# Patient Record
Sex: Male | Born: 1970 | Race: Black or African American | Hispanic: No | Marital: Married | Smoking: Never smoker
Health system: Southern US, Community
[De-identification: ages and names within clinical notes are randomized; demographics above are authoritative.]

## PROBLEM LIST (undated history)

## (undated) DIAGNOSIS — H9202 Otalgia, left ear: Secondary | ICD-10-CM

## (undated) DIAGNOSIS — E7800 Pure hypercholesterolemia, unspecified: Secondary | ICD-10-CM

## (undated) HISTORY — DX: Otalgia, left ear: H92.02

## (undated) HISTORY — PX: TONSILLECTOMY: SUR1361

---

## 1999-04-24 ENCOUNTER — Emergency Department (HOSPITAL_COMMUNITY): Admission: EM | Admit: 1999-04-24 | Discharge: 1999-04-24 | Payer: Self-pay | Admitting: Emergency Medicine

## 2003-09-13 ENCOUNTER — Emergency Department (HOSPITAL_COMMUNITY): Admission: EM | Admit: 2003-09-13 | Discharge: 2003-09-14 | Payer: Self-pay | Admitting: Family Medicine

## 2005-02-09 ENCOUNTER — Emergency Department (HOSPITAL_COMMUNITY): Admission: AD | Admit: 2005-02-09 | Discharge: 2005-02-09 | Payer: Self-pay | Admitting: Emergency Medicine

## 2007-07-28 ENCOUNTER — Encounter: Admission: RE | Admit: 2007-07-28 | Discharge: 2007-07-28 | Payer: Self-pay | Admitting: Internal Medicine

## 2012-06-27 ENCOUNTER — Encounter (HOSPITAL_COMMUNITY): Payer: Self-pay | Admitting: *Deleted

## 2012-06-27 ENCOUNTER — Emergency Department (HOSPITAL_COMMUNITY)
Admission: EM | Admit: 2012-06-27 | Discharge: 2012-06-27 | Disposition: A | Discharge status: Home / Self Care | Payer: 59 | Attending: Emergency Medicine | Admitting: Emergency Medicine

## 2012-06-27 ENCOUNTER — Emergency Department (HOSPITAL_COMMUNITY): Payer: 59

## 2012-06-27 ENCOUNTER — Other Ambulatory Visit: Payer: Self-pay

## 2012-06-27 DIAGNOSIS — R002 Palpitations: Secondary | ICD-10-CM | POA: Insufficient documentation

## 2012-06-27 DIAGNOSIS — R079 Chest pain, unspecified: Secondary | ICD-10-CM | POA: Insufficient documentation

## 2012-06-27 HISTORY — DX: Pure hypercholesterolemia, unspecified: E78.00

## 2012-06-27 LAB — CBC WITH DIFFERENTIAL/PLATELET
Basophils Absolute: 0 10*3/uL (ref 0.0–0.1)
Basophils Relative: 0 % (ref 0–1)
Eosinophils Absolute: 0.1 10*3/uL (ref 0.0–0.7)
Eosinophils Relative: 3 % (ref 0–5)
HCT: 42.4 % (ref 39.0–52.0)
Hemoglobin: 14.4 g/dL (ref 13.0–17.0)
Lymphocytes Relative: 47 % — ABNORMAL HIGH (ref 12–46)
Lymphs Abs: 2.5 10*3/uL (ref 0.7–4.0)
MCH: 28.5 pg (ref 26.0–34.0)
MCHC: 34 g/dL (ref 30.0–36.0)
MCV: 84 fL (ref 78.0–100.0)
Monocytes Absolute: 0.3 10*3/uL (ref 0.1–1.0)
Monocytes Relative: 6 % (ref 3–12)
Neutro Abs: 2.3 10*3/uL (ref 1.7–7.7)
Neutrophils Relative %: 44 % (ref 43–77)
Platelets: 228 10*3/uL (ref 150–400)
RBC: 5.05 MIL/uL (ref 4.22–5.81)
RDW: 12.9 % (ref 11.5–15.5)
WBC: 5.2 10*3/uL (ref 4.0–10.5)

## 2012-06-27 LAB — BASIC METABOLIC PANEL
BUN: 18 mg/dL (ref 6–23)
CO2: 29 mEq/L (ref 19–32)
Calcium: 9.9 mg/dL (ref 8.4–10.5)
Chloride: 101 mEq/L (ref 96–112)
Creatinine, Ser: 1.22 mg/dL (ref 0.50–1.35)
GFR calc Af Amer: 84 mL/min — ABNORMAL LOW (ref >90)
GFR calc non Af Amer: 72 mL/min — ABNORMAL LOW (ref >90)
Glucose, Bld: 81 mg/dL (ref 70–99)
Potassium: 4.2 mEq/L (ref 3.5–5.1)
Sodium: 136 mEq/L (ref 135–145)

## 2012-06-27 LAB — TROPONIN I: Troponin I: <0.3 ng/mL (ref <0.30)

## 2012-06-27 MED ORDER — ASPIRIN 81 MG PO CHEW
162.0000 mg | CHEWABLE_TABLET | Freq: Once | ORAL | Status: AC
  Administered 2012-06-27: 162 mg via ORAL
  Filled 2012-06-27: qty 2

## 2012-06-27 NOTE — ED Provider Notes (Signed)
History  This chart was scribed for Charles B. Bernette Mayers, MD by Shari Heritage. The patient was seen in room APA01/APA01. Patient's care was started at 1251.     CSN: [id number]  Arrival date & time 06/27/12  1241   First MD Initiated Contact with Patient 06/27/12 1251      Chief Complaint  Patient presents with  . Chest Pain     The history is provided by the patient. No language interpreter was used.    Jason Harris is a 41 y.o. male who presents to the Emergency Department complaining of intermittent, pressure-like, moderate chest pain episodes onset 2 weeks ago. There is associated light-headedness, brief episodes of dizziness, arm numbness, and brief heart fluttering. Symptoms are worse when patient is active. Patient says he initially thought symptoms were due to too much caffeine intake and lack of sleep while he was moving into his new house 2 weeks ago, but the symptoms have persisted. Patient says that he saw his PCP on Monday, but at that time, it felt like his symptoms were improving. Patient does not take any daily prescription medications, but he did take 1 aspirin today. He denies history of HTN, diabetes or heart conditions. Patient does not smoke.   PCP - Velna Hatchet ([facility])   Past Medical History  Diagnosis Date  . High cholesterol     History reviewed. No pertinent past surgical history.  History reviewed. No pertinent family history.  History  Substance Use Topics  . Smoking status: Never Smoker   . Smokeless tobacco: Not on file  . Alcohol Use: No      Review of Systems A complete 10 system review of systems was obtained and all systems are negative except as noted in the HPI and PMH.   Allergies  Review of patient's allergies indicates no known allergies.  Home Medications  No current outpatient prescriptions on file.  BP 140/72  Temp 98.6 F (37 C) (Oral)  Resp 18  Ht 5\' 7"  (1.702 m)  Wt 180 lb (81.647 kg)  BMI 28.19  kg/m2  SpO2 98%  Physical Exam  Nursing note and vitals reviewed. Constitutional: He is oriented to person, place, and time. He appears well-developed and well-nourished.  HENT:  Head: Normocephalic and atraumatic.  Eyes: EOM are normal. Pupils are equal, round, and reactive to light.  Neck: Normal range of motion. Neck supple.  Cardiovascular: Normal rate, normal heart sounds and intact distal pulses.   Pulmonary/Chest: Effort normal and breath sounds normal.  Abdominal: Bowel sounds are normal. He exhibits no distension. There is no tenderness.  Musculoskeletal: Normal range of motion. He exhibits no edema and no tenderness.  Neurological: He is alert and oriented to person, place, and time. He has normal strength. No cranial nerve deficit or sensory deficit.  Skin: Skin is warm and dry. No rash noted.  Psychiatric: He has a normal mood and affect.    ED Course  Procedures (including critical care time) DIAGNOSTIC STUDIES: Oxygen Saturation is 98% on room air, normal by my interpretation.    COORDINATION OF CARE: 12:59pm- Patient informed of current plan for treatment and evaluation and agrees with plan at this time.     Labs Reviewed  CBC WITH DIFFERENTIAL - Abnormal; Notable for the following:    Lymphocytes Relative 47 (*)     All other components within normal limits  BASIC METABOLIC PANEL - Abnormal; Notable for the following:    GFR calc non Af Amer 72 (*)  GFR calc Af Amer 84 (*)     All other components within normal limits  TROPONIN I   Dg Chest 2 View  06/27/2012  *RADIOLOGY REPORT*  Clinical Data: Chest pain and shortness of breath.  CHEST - 2 VIEW  Comparison: No priors.  Findings: Lung volumes are normal.  No consolidative airspace disease.  No pleural effusions.  No pneumothorax.  No pulmonary nodule or mass noted.  Pulmonary vasculature and the cardiomediastinal silhouette are within normal limits.  IMPRESSION: 1. No radiographic evidence of acute  cardiopulmonary disease.   Original Report Authenticated By: Florencia Reasons, M.D.      No diagnosis found.    MDM   Date: 06/27/2012  Rate: 66  Rhythm: normal sinus rhythm  QRS Axis: normal  Intervals: normal  ST/T Wave abnormalities: early repolarization  Conduction Disutrbances:none  Narrative Interpretation: ?LVH vs thin patient  Old EKG Reviewed: none available  Labs and imaging here unremarkable as above. PERC neg, low risk for ACS/CAD. Pt's symptoms off and on for several days/weeks. No need for additional inpatient evaluation. Safe for discharge with close PCP follow up for further evaluation.       I personally performed the services described in the documentation, which were scribed in my presence. The recorded information has been reviewed and considered.     Charles B. Bernette Mayers, MD 06/27/12 1413

## 2012-06-27 NOTE — ED Notes (Signed)
Pt with CP off and on since Sunday, took ASA today, with SOB, also feels like heart is fluttering

## 2013-03-18 ENCOUNTER — Ambulatory Visit (HOSPITAL_COMMUNITY)
Admission: RE | Admit: 2013-03-18 | Discharge: 2013-03-18 | Disposition: A | Discharge status: Home / Self Care | Payer: 59 | Source: Ambulatory Visit | Attending: Internal Medicine | Admitting: Internal Medicine

## 2013-03-18 ENCOUNTER — Other Ambulatory Visit: Payer: Self-pay | Admitting: Family

## 2013-03-18 ENCOUNTER — Other Ambulatory Visit (HOSPITAL_COMMUNITY): Payer: Self-pay | Admitting: Internal Medicine

## 2013-03-18 DIAGNOSIS — M79662 Pain in left lower leg: Secondary | ICD-10-CM

## 2013-03-18 DIAGNOSIS — M25562 Pain in left knee: Secondary | ICD-10-CM

## 2013-03-18 DIAGNOSIS — M79609 Pain in unspecified limb: Secondary | ICD-10-CM | POA: Insufficient documentation

## 2013-03-18 NOTE — Progress Notes (Signed)
*  Preliminary Results* Left lower extremity venous duplex completed. Left lower extremity is negative for deep vein thrombosis. There is no evidence of left Baker's cyst.  03/18/2013 2:14 PM  Gertie Fey, RVT, RDCS, RDMS

## 2013-03-22 ENCOUNTER — Other Ambulatory Visit: Payer: Commercial Managed Care - PPO

## 2015-11-27 MED FILL — TOBRAMYCIN-DEXAMETH OPTH SU: 0.3-0.1 | 25 days supply | Qty: 5 | Fill #2

## 2016-01-02 MED FILL — CIPROFLOXACIN HCL 500 MG TA: 500 | 7 days supply | Qty: 14 | Fill #0

## 2016-03-12 MED FILL — AMOX-CLAV 875-125 MG TABLET: 875-125 | 10 days supply | Qty: 20 | Fill #0

## 2017-11-05 DIAGNOSIS — N400 Enlarged prostate without lower urinary tract symptoms: Secondary | ICD-10-CM | POA: Diagnosis not present

## 2017-11-05 DIAGNOSIS — E291 Testicular hypofunction: Secondary | ICD-10-CM | POA: Diagnosis not present

## 2017-11-05 DIAGNOSIS — N402 Nodular prostate without lower urinary tract symptoms: Secondary | ICD-10-CM | POA: Diagnosis not present

## 2017-12-23 MED FILL — levoFLOXacin 750 MG TABS: 750 | 1 days supply | Qty: 1 | Fill #0

## 2017-12-24 DIAGNOSIS — N402 Nodular prostate without lower urinary tract symptoms: Secondary | ICD-10-CM | POA: Diagnosis not present

## 2017-12-28 ENCOUNTER — Observation Stay (HOSPITAL_COMMUNITY)
Admission: EM | Admit: 2017-12-28 | Discharge: 2017-12-29 | Disposition: A | Discharge status: Home / Self Care | Payer: 59 | Attending: Family Medicine | Admitting: Family Medicine

## 2017-12-28 ENCOUNTER — Encounter (HOSPITAL_COMMUNITY): Payer: Self-pay | Admitting: Emergency Medicine

## 2017-12-28 ENCOUNTER — Emergency Department (HOSPITAL_COMMUNITY): Payer: 59

## 2017-12-28 DIAGNOSIS — E7800 Pure hypercholesterolemia, unspecified: Secondary | ICD-10-CM | POA: Diagnosis not present

## 2017-12-28 DIAGNOSIS — R079 Chest pain, unspecified: Secondary | ICD-10-CM | POA: Diagnosis not present

## 2017-12-28 DIAGNOSIS — R0602 Shortness of breath: Secondary | ICD-10-CM | POA: Diagnosis not present

## 2017-12-28 DIAGNOSIS — R0789 Other chest pain: Secondary | ICD-10-CM | POA: Diagnosis not present

## 2017-12-28 DIAGNOSIS — I5021 Acute systolic (congestive) heart failure: Secondary | ICD-10-CM

## 2017-12-28 DIAGNOSIS — Z79899 Other long term (current) drug therapy: Secondary | ICD-10-CM | POA: Insufficient documentation

## 2017-12-28 DIAGNOSIS — E785 Hyperlipidemia, unspecified: Secondary | ICD-10-CM | POA: Insufficient documentation

## 2017-12-28 DIAGNOSIS — R071 Chest pain on breathing: Secondary | ICD-10-CM | POA: Diagnosis not present

## 2017-12-28 LAB — BASIC METABOLIC PANEL
Anion gap: 8 (ref 5–15)
BUN: 14 mg/dL (ref 6–20)
CO2: 27 mmol/L (ref 22–32)
Calcium: 9.5 mg/dL (ref 8.9–10.3)
Chloride: 107 mmol/L (ref 101–111)
Creatinine, Ser: 1.05 mg/dL (ref 0.61–1.24)
GFR calc Af Amer: >60 mL/min (ref >60)
GFR calc non Af Amer: >60 mL/min (ref >60)
Glucose, Bld: 101 mg/dL — ABNORMAL HIGH (ref 65–99)
Potassium: 4.7 mmol/L (ref 3.5–5.1)
Sodium: 142 mmol/L (ref 135–145)

## 2017-12-28 LAB — URINALYSIS, ROUTINE W REFLEX MICROSCOPIC
Bacteria, UA: NONE SEEN
Bilirubin Urine: NEGATIVE
Glucose, UA: NEGATIVE mg/dL
Ketones, ur: NEGATIVE mg/dL
Leukocytes, UA: NEGATIVE
Nitrite: NEGATIVE
Protein, ur: NEGATIVE mg/dL
Specific Gravity, Urine: 1.019 (ref 1.005–1.030)
Squamous Epithelial / LPF: NONE SEEN
pH: 7 (ref 5.0–8.0)

## 2017-12-28 LAB — I-STAT TROPONIN, ED: Troponin i, poc: 0 ng/mL (ref 0.00–0.08)

## 2017-12-28 LAB — CBC
HCT: 41.9 % (ref 39.0–52.0)
Hemoglobin: 13.7 g/dL (ref 13.0–17.0)
MCH: 28 pg (ref 26.0–34.0)
MCHC: 32.7 g/dL (ref 30.0–36.0)
MCV: 85.7 fL (ref 78.0–100.0)
Platelets: 251 10*3/uL (ref 150–400)
RBC: 4.89 MIL/uL (ref 4.22–5.81)
RDW: 12.8 % (ref 11.5–15.5)
WBC: 6.2 10*3/uL (ref 4.0–10.5)

## 2017-12-28 MED ORDER — IOPAMIDOL (ISOVUE-370) INJECTION 76%
INTRAVENOUS | Status: AC
  Filled 2017-12-28: qty 100

## 2017-12-28 MED ORDER — IOPAMIDOL (ISOVUE-370) INJECTION 76%
100.0000 mL | Freq: Once | INTRAVENOUS | Status: AC | PRN
  Administered 2017-12-28: 100 mL via INTRAVENOUS

## 2017-12-28 NOTE — H&P (Signed)
History and Physical    Jason Harris ZOX:[id number] DOB: [birth year] DOA: 12/28/2017  Referring MD/NP/PA: Dr Freida Busman  PCP: Patient, No Pcp Per   Outpatient Specialists: None   Patient coming from: Home  Chief Complaint: Chest pain  HPI: Jason Harris is a 47 y.o. male with medical history significant of hyperlipidemia who was having exercise yesterday and sustained chest tightness. He was actually dancing at the time when it happened. The pain got better with rest. He got up this morning and tried to do some activities the chest pain came back. Described as more chest tightness than pain. Rated as 7 out of 10. No significant shortness of breath. No cough. Patient has no prior cardiac history. He had recent posterior biopsy about a week ago. He has been doing fine since then. The only finding has been hematuria after the biopsy.  ED Course: Patient evaluated in the ER. Initial cardiac enzymes negative.EKG also negative. CT angiogram of the chest is negative for PE. With the exertional nature of his symptoms he is being admitted for workup  Review of Systems: As per HPI otherwise 10 point review of systems negative.    Past Medical History:  Diagnosis Date  . High cholesterol     History reviewed. No pertinent surgical history.   reports that he has never smoked. He has never used smokeless tobacco. He reports that he drinks alcohol. He reports that he does not use drugs.  Allergies  Allergen Reactions  . Penicillins     Has patient had a PCN reaction causing immediate rash, facial/tongue/throat swelling, SOB or lightheadedness with hypotension: /No Has patient had a PCN reaction causing severe rash involving mucus membranes or skin necrosis: /No Has patient had a PCN reaction that required hospitalization: No Has patient had a PCN reaction occurring within the last 10 years: /No If all of the above answers are "NO", then may proceed with Cephalosporin use.   Marland Kitchen  Amoxicillin Rash    Has patient had a PCN reaction causing immediate rash, facial/tongue/throat swelling, SOB or lightheadedness with hypotension: no Has patient had a PCN reaction causing severe rash involving mucus membranes or skin necrosis: No Has patient had a PCN reaction that required hospitalization: {No Has patient had a PCN reaction occurring within the last 10 years: No/ If all of the above answers are "NO", then may proceed with Cephalosporin use.     No family history on file.   Prior to Admission medications   Medication Sig Start Date End Date Taking? Authorizing Provider  acetaminophen (TYLENOL) 500 MG tablet Take 1,000 mg by mouth at bedtime as needed for moderate pain.   Yes [provider]  Cholecalciferol (VITAMIN D PO) Take 1,000 Units by mouth daily.   Yes [provider]  Thiamine HCl (VITAMIN B-1 PO) Take 1 tablet by mouth daily.   Yes [provider]  VITAMIN E PO Take 1 tablet by mouth daily.    Yes [provider]    Physical Exam: Vitals:   12/28/17 1507 12/28/17 1640 12/28/17 1943 12/28/17 2200  BP: 122/77 129/81 (!) 115/93 112/82  Pulse: 70 83 60 65  Resp: 16 18 12 16   Temp: 98.4 F (36.9 C)     TempSrc: Oral     SpO2: 100% 100% 100% 99%  Weight:          Constitutional: NAD, calm, comfortable Vitals:   12/28/17 1507 12/28/17 1640 12/28/17 1943 12/28/17 2200  BP: 122/77 129/81 Marland Kitchen)  115/93 112/82  Pulse: 70 83 60 65  Resp: 16 18 12 16   Temp: 98.4 F (36.9 C)     TempSrc: Oral     SpO2: 100% 100% 100% 99%  Weight:       Eyes: PERRL, lids and conjunctivae normal ENMT: Mucous membranes are moist. Posterior pharynx clear of any exudate or lesions.Normal dentition.  Neck: normal, supple, no masses, no thyromegaly Respiratory: clear to auscultation bilaterally, no wheezing, no crackles. Normal respiratory effort. No accessory muscle use.  Cardiovascular: Regular rate and rhythm, no murmurs / rubs / gallops.  No extremity edema. 2+ pedal pulses. No carotid bruits.  Abdomen: no tenderness, no masses palpated. No hepatosplenomegaly. Bowel sounds positive.  Musculoskeletal: no clubbing / cyanosis. No joint deformity upper and lower extremities. Good ROM, no contractures. Normal muscle tone.  Skin: no rashes, lesions, ulcers. No induration Neurologic: CN 2-12 grossly intact. Sensation intact, DTR normal. Strength 5/5 in all 4.  Psychiatric: Normal judgment and insight. Alert and oriented x 3. Normal mood.    Labs on Admission: I have personally reviewed following labs and imaging studies  CBC: Recent Labs  Lab 12/28/17 1525  WBC 6.2  HGB 13.7  HCT 41.9  MCV 85.7  PLT 251   Basic Metabolic Panel: Recent Labs  Lab 12/28/17 1525  NA 142  K 4.7  CL 107  CO2 27  GLUCOSE 101*  BUN 14  CREATININE 1.05  CALCIUM 9.5   GFR: CrCl cannot be calculated (Unknown ideal weight.). Liver Function Tests: No results for input(s): AST, ALT, ALKPHOS, BILITOT, PROT, ALBUMIN in the last 168 hours. No results for input(s): LIPASE, AMYLASE in the last 168 hours. No results for input(s): AMMONIA in the last 168 hours. Coagulation Profile: No results for input(s): INR, PROTIME in the last 168 hours. Cardiac Enzymes: No results for input(s): CKTOTAL, CKMB, CKMBINDEX, TROPONINI in the last 168 hours. BNP (last 3 results) No results for input(s): PROBNP in the last 8760 hours. HbA1C: No results for input(s): HGBA1C in the last 72 hours. CBG: No results for input(s): GLUCAP in the last 168 hours. Lipid Profile: No results for input(s): CHOL, HDL, LDLCALC, TRIG, CHOLHDL, LDLDIRECT in the last 72 hours. Thyroid Function Tests: No results for input(s): TSH, T4TOTAL, FREET4, T3FREE, THYROIDAB in the last 72 hours. Anemia Panel: No results for input(s): VITAMINB12, FOLATE, FERRITIN, TIBC, IRON, RETICCTPCT in the last 72 hours. Urine analysis:    Component Value Date/Time   COLORURINE YELLOW 12/28/2017  2002   APPEARANCEUR CLEAR 12/28/2017 2002   LABSPEC 1.019 12/28/2017 2002   PHURINE 7.0 12/28/2017 2002   GLUCOSEU NEGATIVE 12/28/2017 2002   HGBUR SMALL (A) 12/28/2017 2002   BILIRUBINUR NEGATIVE 12/28/2017 2002   KETONESUR NEGATIVE 12/28/2017 2002   PROTEINUR NEGATIVE 12/28/2017 2002   NITRITE NEGATIVE 12/28/2017 2002   LEUKOCYTESUR NEGATIVE 12/28/2017 2002   Sepsis Labs: @LABRCNTIP (procalcitonin:4,lacticidven:4) )No results found for this or any previous visit (from the past 240 hour(s)).   Radiological Exams on Admission: Dg Chest 2 View  Result Date: 12/28/2017 CLINICAL DATA:  Chest pain and shortness of breath. EXAM: CHEST - 2 VIEW COMPARISON:  Chest x-ray dated June 16, 2012. FINDINGS: The heart size and mediastinal contours are within normal limits. Both lungs are clear. The visualized skeletal structures are unremarkable. IMPRESSION: No active cardiopulmonary disease. Electronically Signed   By: Obie Dredge M.D.   On: 12/28/2017 15:25   Ct Angio Chest Pe W/cm &/or Wo Cm  Result Date: 12/28/2017 CLINICAL DATA:  Shortness of breath EXAM: CT ANGIOGRAPHY CHEST WITH CONTRAST TECHNIQUE: Multidetector CT imaging of the chest was performed using the standard protocol during bolus administration of intravenous contrast. Multiplanar CT image reconstructions and MIPs were obtained to evaluate the vascular anatomy. CONTRAST:  100mL ISOVUE-370 IOPAMIDOL (ISOVUE-370) INJECTION 76% COMPARISON:  Chest radiograph December 28, 2017 FINDINGS: Cardiovascular: There is no demonstrable pulmonary embolus. There is no appreciable thoracic aortic aneurysm or dissection. Visualized great vessels appear unremarkable. No pericardial effusion or pericardial thickening is evident. Mediastinum/Nodes: Thyroid appears normal. There is no appreciable thoracic adenopathy. No esophageal lesions are evident. Lungs/Pleura: There is a 3 mm nodular opacity in the apical segment of the right upper lobe seen on axial slice  23 series 11 and coronal slice 97 series 7. There is no edema or consolidation. No pleural effusion or pleural thickening evident. Upper Abdomen: Visualized upper abdominal structures appear normal. Musculoskeletal: There are no blastic or lytic bone lesions. There are no appreciable chest wall lesions. Review of the MIP images confirms the above findings. IMPRESSION: 1. No demonstrable pulmonary embolus. No thoracic aortic aneurysm or dissection. 2. 3 mm nodular opacity apical segment right upper lobe. No follow-up needed if patient is low-risk. Non-contrast chest CT can be considered in 12 months if patient is high-risk. This recommendation follows the consensus statement: Guidelines for Management of Incidental Pulmonary Nodules Detected on CT Images: From the Fleischner Society 2017; Radiology 2017; 284:228-243. 3.  No edema or consolidation.  No pleural effusion. 4.  No appreciable thoracic adenopathy. Electronically Signed   By: Bretta BangWilliam  Woodruff III M.D.   On: 12/28/2017 21:32    EKG: Independently reviewed. Shows normal sinus rhythm with no specific ST changes  Assessment/Plan Principal Problem:   Chest pain Active Problems:   Hyperlipemia     #1 Chest pain: exertional in nature. Worrisome for cardiac causes. No PE on CT angiogram. We will cycle patient's enzymes. Nitroglycerin as needed and morphine. Also oxygen. Exercise cardiac stress test will be ordered since symptoms are triggered by exertion.  #2 hyperlipidemia: Patient minute to be started on statin. Check fasting lipid panel.  #3 mild shortness of breath with exertion: Again related to possible cardiac cause. Patient not hypoxic  #4 recent prostate biopsy: Patient has had mild hematuria at home. Vitals and labs are completely normal now.   DVT prophylaxis: Lovenox  Code Status: Full  Family Communication: None around  Disposition Plan: Home  Consults called: None  Admission status: Obs  Severity of Illness: The  appropriate patient status for this patient is OBSERVATION. Observation status is judged to be reasonable and necessary in order to provide the required intensity of service to ensure the patient's safety. The patient's presenting symptoms, physical exam findings, and initial radiographic and laboratory data in the context of their medical condition is felt to place them at decreased risk for further clinical deterioration. Furthermore, it is anticipated that the patient will be medically stable for discharge from the hospital within 2 midnights of admission. The following factors support the patient status of observation.   " The patient's presenting symptoms include Chest pain. " The physical exam findings include None. " The initial radiographic and laboratory data are HTN.     Lonia BloodGARBA,LAWAL MD [facility] Pager [phone]  If 7PM-7AM, please contact night-coverage [email or web address] Password [facility]  12/28/2017, 10:51 PM

## 2017-12-28 NOTE — ED Triage Notes (Signed)
Last night felt SOB with exertion and had pain up his back to his neck. reports when at home when going up stairs today having SOB and mid chest pains.  Wed had prostate biopsy.

## 2017-12-28 NOTE — ED Provider Notes (Signed)
[facility]-EMERGENCY DEPT Provider Note   CSN: [id number] Arrival date & time: 12/28/17  1457     History   Chief Complaint Chief Complaint  Patient presents with  . Chest Pain  . Shortness of Breath    HPI Jason Harris is a 47 y.o. male.  47 year old male presents with acute onset of dyspnea chest tightness when he was dancing yesterday.  Symptoms got better with rest.  His symptoms returned again this morning when he got up and did activity.  No cough or congestion.  Some chest tightness but no severe pressure.  No associated nausea or vomiting.  No diaphoresis.  His symptoms once again resolved with rest denies any lower extremity swelling.  Patient did have a prostate biopsy performed last week.  Did initially have some hematuria which is since resolved.  Had some blisters on his hands and feet which is also since resolved.    No prior history of same.  Nothing used for the symptoms prior to arrival.  He is currently asymptomatic     Past Medical History:  Diagnosis Date  . High cholesterol     There are no active problems to display for this patient.   History reviewed. No pertinent surgical history.      Home Medications    Prior to Admission medications   Medication Sig Start Date End Date Taking? Authorizing Provider  TESTOSTERONE TD Place 1 application onto the skin 2 (two) times daily. Specialty compound    [provider]    Family History No family history on file.  Social History Social History   Tobacco Use  . Smoking status: Never Smoker  . Smokeless tobacco: Never Used  Substance Use Topics  . Alcohol use: Yes  . Drug use: No     Allergies   Patient has no known allergies.   Review of Systems Review of Systems  All other systems reviewed and are negative.    Physical Exam Updated Vital Signs BP (!) 115/93 (BP Location: Left Arm)   Pulse 60   Temp 98.4 F (36.9 C) (Oral)   Resp 12   Wt 78  kg (172 lb)   SpO2 100%   BMI 26.94 kg/m   Physical Exam  Constitutional: He is oriented to person, place, and time. He appears well-developed and well-nourished.  Non-toxic appearance. No distress.  HENT:  Head: Normocephalic and atraumatic.  Eyes: Pupils are equal, round, and reactive to light. Conjunctivae, EOM and lids are normal.  Neck: Normal range of motion. Neck supple. No tracheal deviation present. No thyroid mass present.  Cardiovascular: Normal rate, regular rhythm and normal heart sounds. Exam reveals no gallop.  No murmur heard. Pulmonary/Chest: Effort normal and breath sounds normal. No stridor. No respiratory distress. He has no decreased breath sounds. He has no wheezes. He has no rhonchi. He has no rales.  Abdominal: Soft. Normal appearance and bowel sounds are normal. He exhibits no distension. There is no tenderness. There is no rebound and no CVA tenderness.  Musculoskeletal: Normal range of motion. He exhibits no edema or tenderness.  Neurological: He is alert and oriented to person, place, and time. He has normal strength. No cranial nerve deficit or sensory deficit. GCS eye subscore is 4. GCS verbal subscore is 5. GCS motor subscore is 6.  Skin: Skin is warm and dry. No abrasion and no rash noted.  Psychiatric: He has a normal mood and affect. His speech is normal and behavior is  normal.  Nursing note and vitals reviewed.    ED Treatments / Results  Labs (all labs ordered are listed, but only abnormal results are displayed) Labs Reviewed  BASIC METABOLIC PANEL - Abnormal; Notable for the following components:      Result Value   Glucose, Bld 101 (*)    All other components within normal limits  CBC  URINALYSIS, ROUTINE W REFLEX MICROSCOPIC  I-STAT TROPONIN, ED    EKG EKG Interpretation  Date/Time:  Sunday December 28 2017 15:05:53 EDT Ventricular Rate:  72 PR Interval:    QRS Duration: 79 QT Interval:  352 QTC Calculation: 386 R Axis:   81 Text  Interpretation:  Sinus rhythm Consider left ventricular hypertrophy ST elev, probable normal early repol pattern No significant change since last tracing Confirmed by Shaune Pollack [phone]) on 12/28/2017 7:26:10 PM Also confirmed by Lorre Nick ([id number])  on 12/28/2017 8:00:00 PM   Radiology Dg Chest 2 View  Result Date: 12/28/2017 CLINICAL DATA:  Chest pain and shortness of breath. EXAM: CHEST - 2 VIEW COMPARISON:  Chest x-ray dated June 16, 2012. FINDINGS: The heart size and mediastinal contours are within normal limits. Both lungs are clear. The visualized skeletal structures are unremarkable. IMPRESSION: No active cardiopulmonary disease. Electronically Signed   By: Obie Dredge M.D.   On: 12/28/2017 15:25    Procedures Procedures (including critical care time)  Medications Ordered in ED Medications - No data to display   Initial Impression / Assessment and Plan / ED Course  I have reviewed the triage vital signs and the nursing notes.  Pertinent labs & imaging results that were available during my care of the patient were reviewed by me and considered in my medical decision making (see chart for details).    Patient with exertional dyspnea as well as chest tightness.  His EKG shows no signs of acute coronary ischemia.  Troponin negative.  Did have recent surgery and chest CT negative for PE.  Will consult hospitalist for admit for chest pain rule out  Final Clinical Impressions(s) / ED Diagnoses   Final diagnoses:  None    ED Discharge Orders    None       Lorre Nick, MD 12/28/17 2230

## 2017-12-29 ENCOUNTER — Ambulatory Visit (HOSPITAL_BASED_OUTPATIENT_CLINIC_OR_DEPARTMENT_OTHER)
Admit: 2017-12-29 | Discharge: 2017-12-29 | Disposition: A | Discharge status: Home / Self Care | Payer: 59 | Attending: Cardiology | Admitting: Cardiology

## 2017-12-29 ENCOUNTER — Other Ambulatory Visit: Payer: Self-pay | Admitting: Cardiology

## 2017-12-29 ENCOUNTER — Encounter (HOSPITAL_COMMUNITY): Payer: Self-pay

## 2017-12-29 ENCOUNTER — Other Ambulatory Visit: Payer: Self-pay

## 2017-12-29 DIAGNOSIS — R071 Chest pain on breathing: Secondary | ICD-10-CM

## 2017-12-29 DIAGNOSIS — R079 Chest pain, unspecified: Secondary | ICD-10-CM | POA: Diagnosis not present

## 2017-12-29 DIAGNOSIS — I5021 Acute systolic (congestive) heart failure: Secondary | ICD-10-CM

## 2017-12-29 DIAGNOSIS — E7800 Pure hypercholesterolemia, unspecified: Secondary | ICD-10-CM

## 2017-12-29 DIAGNOSIS — R0789 Other chest pain: Secondary | ICD-10-CM | POA: Diagnosis not present

## 2017-12-29 DIAGNOSIS — R0602 Shortness of breath: Secondary | ICD-10-CM | POA: Diagnosis not present

## 2017-12-29 DIAGNOSIS — Z79899 Other long term (current) drug therapy: Secondary | ICD-10-CM | POA: Diagnosis not present

## 2017-12-29 DIAGNOSIS — E785 Hyperlipidemia, unspecified: Secondary | ICD-10-CM | POA: Diagnosis not present

## 2017-12-29 LAB — CBC
HEMATOCRIT: 39.7 % (ref 39.0–52.0)
HEMOGLOBIN: 12.9 g/dL — AB (ref 13.0–17.0)
MCH: 27.9 pg (ref 26.0–34.0)
MCHC: 32.5 g/dL (ref 30.0–36.0)
MCV: 85.7 fL (ref 78.0–100.0)
Platelets: 234 10*3/uL (ref 150–400)
RBC: 4.63 MIL/uL (ref 4.22–5.81)
RDW: 13.1 % (ref 11.5–15.5)
WBC: 5.3 10*3/uL (ref 4.0–10.5)

## 2017-12-29 LAB — NM MYOCAR MULTI W/SPECT W/WALL MOTION / EF
CHL CUP MPHR: 173 {beats}/min
CSEPEDS: 2 s
CSEPEW: 10.4 METS
Exercise duration (min): 9 min
Peak HR: 164 {beats}/min
Percent HR: 94 %
Rest HR: 54 {beats}/min

## 2017-12-29 LAB — TROPONIN I: Troponin I: <0.03 ng/mL (ref <0.03)

## 2017-12-29 LAB — CREATININE, SERUM: CREATININE: 0.99 mg/dL (ref 0.61–1.24)

## 2017-12-29 LAB — HIV ANTIBODY (ROUTINE TESTING W REFLEX): HIV Screen 4th Generation wRfx: NONREACTIVE

## 2017-12-29 MED ORDER — MORPHINE SULFATE (PF) 4 MG/ML IV SOLN: 2.0000 mg | INTRAVENOUS | Status: DC | PRN

## 2017-12-29 MED ORDER — ALPRAZOLAM 0.25 MG PO TABS: 0.2500 mg | ORAL_TABLET | Freq: Two times a day (BID) | ORAL | Status: DC | PRN

## 2017-12-29 MED ORDER — ENOXAPARIN SODIUM 40 MG/0.4ML ~~LOC~~ SOLN: 40.0000 mg | Freq: Every day | SUBCUTANEOUS | Status: DC

## 2017-12-29 MED ORDER — ACETAMINOPHEN 325 MG PO TABS: 650.0000 mg | ORAL_TABLET | ORAL | Status: DC | PRN

## 2017-12-29 MED ORDER — TECHNETIUM TC 99M TETROFOSMIN IV KIT
30.0000 | PACK | Freq: Once | INTRAVENOUS | Status: AC | PRN
  Administered 2017-12-29: 30 via INTRAVENOUS

## 2017-12-29 MED ORDER — TECHNETIUM TC 99M TETROFOSMIN IV KIT
10.0000 | PACK | Freq: Once | INTRAVENOUS | Status: AC | PRN
  Administered 2017-12-29: 10 via INTRAVENOUS

## 2017-12-29 MED ORDER — ONDANSETRON HCL 4 MG/2ML IJ SOLN: 4.0000 mg | Freq: Four times a day (QID) | INTRAMUSCULAR | Status: DC | PRN

## 2017-12-29 MED ORDER — ENOXAPARIN SODIUM 40 MG/0.4ML ~~LOC~~ SOLN: 40.0000 mg | SUBCUTANEOUS | Status: DC

## 2017-12-29 NOTE — Discharge Instructions (Signed)
1)Maintain adequate hydration 2)May resume work and regular activities without restrictions at this time 3) follow-up with your primary care doctor if symptoms persist or as needed

## 2017-12-29 NOTE — Discharge Summary (Addendum)
Jason Harris, is a 47 y.o. male  DOB 1970/10/09  MRN [id number].  Admission date:  12/28/2017  Admitting Physician  Rometta Emery, MD  Discharge Date:  12/29/2017   Primary MD  Patient, No Pcp Per  Recommendations for primary care physician for things to follow:   1)Maintain adequate hydration 2)May resume work and regular activities without restrictions at this time 3) follow-up with your primary care doctor if symptoms persist or as needed  Admission Diagnosis  Other chest pain [R07.89]   Discharge Diagnosis  Other chest pain [R07.89]    Principal Problem:   Chest pain Active Problems:   Excellent Lipid Profile /HDL is >125      Past Medical History:  Diagnosis Date  . High cholesterol     History reviewed. No pertinent surgical history.     HPI  from the history and physical done on the day of admission:     Chief Complaint: Chest pain  HPI: Jason Harris is a 47 y.o. male with medical history significant of hyperlipidemia who was having exercise yesterday and sustained chest tightness. He was actually dancing at the time when it happened. The pain got better with rest. He got up this morning and tried to do some activities the chest pain came back. Described as more chest tightness than pain. Rated as 7 out of 10. No significant shortness of breath. No cough. Patient has no prior cardiac history. He had recent posterior biopsy about a week ago. He has been doing fine since then. The only finding has been hematuria after the biopsy.  ED Course: Patient evaluated in the ER. Initial cardiac enzymes negative.EKG also negative. CT angiogram of the chest is negative for PE. With the exertional nature of his symptoms he is being admitted for workup  Excellent Lipid Profile /HDL is >125      Hospital Course:   Brief summary 47 year old male without any significant past  medical history and who has Excellent Lipid Profile with HDL is >125 admitted on 12/28/2017 with exertional chest discomfort.    Plan:-  #1 Chest pain: exertional in nature.  Ruled out for ACS by cardiac enzymes and EKG, cardiology consult appreciated, excellent Lipid Profile with HDL is >125 ,  CTA chest, chest x-ray and lower extremity Dopplers without acute findings.  Nuclear stress test without evidence of reversible ischemia  #2 hyperlipidemia: Excellent Lipid Profile with HDL is >125  #3 mild shortness of breath with exertion:  Extensive workup as above #1-, dyspnea was only related to chest pain otherwise he had no dyspnea  #4 recent prostate biopsy: Patient has had mild hematuria at home. Vitals and labs are completely normal now.  As per patient prostate biopsy was apparently without malignancy    Discharge Condition: stable, plan of care and workup findings discussed with patient and his wife Jason Harris at bedside  Follow UP- PCP    Consults obtained -cardiology  Diet and Activity recommendation:  As advised  Discharge Instructions  Discharge Instructions    Call MD for:  difficulty breathing, headache or visual disturbances   Complete by:  As directed    Call MD for:  persistant dizziness or light-headedness   Complete by:  As directed    Call MD for:  persistant nausea and vomiting   Complete by:  As directed    Call MD for:  severe uncontrolled pain   Complete by:  As directed    Call MD for:  temperature >100.4   Complete by:  As directed    Diet general   Complete by:  As directed    Discharge instructions   Complete by:  As directed    1)Maintain adequate hydration 2)May resume work and regular activities without restrictions at this time 3) follow-up with your primary care doctor if symptoms persist or as needed   Increase activity slowly   Complete by:  As directed         Discharge Medications     Allergies as of 12/29/2017      Reactions    Penicillins    Has patient had a PCN reaction causing immediate rash, facial/tongue/throat swelling, SOB or lightheadedness with hypotension: /No Has patient had a PCN reaction causing severe rash involving mucus membranes or skin necrosis: /No Has patient had a PCN reaction that required hospitalization: No Has patient had a PCN reaction occurring within the last 10 years: /No If all of the above answers are "NO", then may proceed with Cephalosporin use.   Amoxicillin Rash   Has patient had a PCN reaction causing immediate rash, facial/tongue/throat swelling, SOB or lightheadedness with hypotension: no Has patient had a PCN reaction causing severe rash involving mucus membranes or skin necrosis: No Has patient had a PCN reaction that required hospitalization: {No Has patient had a PCN reaction occurring within the last 10 years: No/ If all of the above answers are "NO", then may proceed with Cephalosporin use.      Medication List    TAKE these medications   acetaminophen 500 MG tablet Commonly known as:  TYLENOL Take 1,000 mg by mouth at bedtime as needed for moderate pain.   VITAMIN B-1 PO Take 1 tablet by mouth daily.   VITAMIN D PO Take 1,000 Units by mouth daily.   VITAMIN E PO Take 1 tablet by mouth daily.       Major procedures and Radiology Reports - PLEASE review detailed and final reports for all details, in brief -   Dg Chest 2 View  Result Date: 12/28/2017 CLINICAL DATA:  Chest pain and shortness of breath. EXAM: CHEST - 2 VIEW COMPARISON:  Chest x-ray dated June 16, 2012. FINDINGS: The heart size and mediastinal contours are within normal limits. Both lungs are clear. The visualized skeletal structures are unremarkable. IMPRESSION: No active cardiopulmonary disease. Electronically Signed   By: Obie Dredge M.D.   On: 12/28/2017 15:25   Ct Angio Chest Pe W/cm &/or Wo Cm  Result Date: 12/28/2017 CLINICAL DATA:  Shortness of breath EXAM: CT ANGIOGRAPHY CHEST  WITH CONTRAST TECHNIQUE: Multidetector CT imaging of the chest was performed using the standard protocol during bolus administration of intravenous contrast. Multiplanar CT image reconstructions and MIPs were obtained to evaluate the vascular anatomy. CONTRAST:  ISOVUE-370 IOPAMIDOL (ISOVUE-370) INJECTION 76% COMPARISON:  Chest radiograph December 28, 2017 FINDINGS: Cardiovascular: There is no demonstrable pulmonary embolus. There is no appreciable thoracic aortic aneurysm or dissection. Visualized great vessels appear unremarkable. No pericardial effusion or pericardial  thickening is evident. Mediastinum/Nodes: Thyroid appears normal. There is no appreciable thoracic adenopathy. No esophageal lesions are evident. Lungs/Pleura: There is a 3 mm nodular opacity in the apical segment of the right upper lobe seen on axial slice 23 series 11 and coronal slice 97 series 7. There is no edema or consolidation. No pleural effusion or pleural thickening evident. Upper Abdomen: Visualized upper abdominal structures appear normal. Musculoskeletal: There are no blastic or lytic bone lesions. There are no appreciable chest wall lesions. Review of the MIP images confirms the above findings. IMPRESSION: 1. No demonstrable pulmonary embolus. No thoracic aortic aneurysm or dissection. 2. 3 mm nodular opacity apical segment right upper lobe. No follow-up needed if patient is low-risk. Non-contrast chest CT can be considered in 12 months if patient is high-risk. This recommendation follows the consensus statement: Guidelines for Management of Incidental Pulmonary Nodules Detected on CT Images: From the Fleischner Society 2017; Radiology 2017; 284:228-243. 3.  No edema or consolidation.  No pleural effusion. 4.  No appreciable thoracic adenopathy. Electronically Signed   By: Bretta Bang III M.D.   On: 12/28/2017 21:32   Nm Myocar Multi W/spect W/wall Motion / Ef  Result Date: 12/29/2017 CLINICAL DATA:  Chest pain. Family  history of cardiac disease. Hypercholesterolemia. Shortness of breath. EXAM: MYOCARDIAL IMAGING WITH SPECT (REST AND EXERCISE) GATED LEFT VENTRICULAR WALL MOTION STUDY LEFT VENTRICULAR EJECTION FRACTION TECHNIQUE: Standard myocardial SPECT imaging was performed after resting intravenous injection of 10 mCi Tc-34m tetrofosmin. Subsequently, exercise tolerance test was performed by the patient under the supervision of the Cardiology staff. At peak-stress, 30 mCi Tc-70m tetrofosmin was injected intravenously and standard myocardial SPECT imaging was performed. Quantitative gated imaging was also performed to evaluate left ventricular wall motion, and estimate left ventricular ejection fraction. COMPARISON:  CT chest 12/28/2017 FINDINGS: Perfusion: No decreased activity in the left ventricle on stress imaging to suggest reversible ischemia or infarction. Wall Motion: Normal left ventricular wall motion. No left ventricular dilation. Left Ventricular Ejection Fraction: 49 % End diastolic volume 109 ml End systolic volume 56 ml IMPRESSION: 1. No reversible ischemia or infarction. 2. Normal left ventricular wall motion. 3. Left ventricular ejection fraction 49% 4. Non invasive risk stratification*: Low *2012 Appropriate Use Criteria for Coronary Revascularization Focused Update: J Am Coll Cardiol. 2012;59(9):857-881. http://content.dementiazones.com.aspx?articleid=[email or web address] Electronically Signed   By: Marin Roberts M.D.   On: 12/29/2017 15:43    Micro Results   No results found for this or any previous visit (from the past 240 hour(s)).     Today   Subjective    Jason Harris today has no new complaints          Patient has been seen and examined prior to discharge   Objective   Blood pressure (!) 105/57, pulse (!) 57, temperature 98 F (36.7 C), temperature source Oral, resp. rate 18, height 5\' 6"  (1.676 m), weight 79.9 kg (176 lb 3.2 oz), SpO2 99 %.  No intake or output data in the  24 hours ending 12/29/17 1607  Exam Gen:- Awake Alert,  In no apparent distress  HEENT:- [place].AT, No sclera icterus Neck-Supple Neck,No JVD,.  Lungs-  CTAB , good air movement CV- S1, S2 normal Abd-  +ve B.Sounds, Abd Soft, No tenderness,    Extremity/Skin:- No  edema,   good pulses Psych-affect is appropriate, oriented x3 Neuro-no new focal deficits, no tremors   Data Review   CBC w Diff:  Lab Results  Component Value Date   WBC 5.3 12/29/2017  HGB 12.9 (L) 12/29/2017   HCT 39.7 12/29/2017   PLT 234 12/29/2017   LYMPHOPCT 47 (H) 06/27/2012   MONOPCT 6 06/27/2012   EOSPCT 3 06/27/2012   BASOPCT 0 06/27/2012    CMP:  Lab Results  Component Value Date   NA 142 12/28/2017   K 4.7 12/28/2017   CL 107 12/28/2017   CO2 27 12/28/2017   BUN 14 12/28/2017   CREATININE 0.99 12/29/2017  .   Total Discharge time is about 33 minutes  Shon Haleourage Britanni Yarde M.D on 12/29/2017 at 4:07 PM  [facility]   Office  [phone]  Voice Recognition Reubin Milan/Dragon dictation system was used to create this note, attempts have been made to correct errors. Please contact the author with questions and/or clarifications.

## 2017-12-29 NOTE — Consult Note (Addendum)
Cardiology Consultation:   Patient ID: Jason Harris; [id number]; [birth year]   Admit date: 12/28/2017 Date of Consult: 12/29/2017  Primary Care Provider: Patient, No Pcp Per Primary Cardiologist: Chilton Si, MD new Primary Electrophysiologist:  NA   Patient Profile:   Jason Harris is a 47 y.o. male with a hx of HLD,  who is being seen today for the evaluation of chest pain at the request of Dr. Mariea Clonts.  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  Pt was dancing yesterday and developed chest tightness.  He rested and pain improved.  The next AM he tried to do some activities and chest tightness returned.  No significant SOB,  Recent prostate biopsy, without complications.       In ER  EKG SR with early repol most likely possible LVH no acute changed from 2013.  I personally reviewed.   Telemetry:  Telemetry was personally reviewed and demonstrates:  SR.  Troponin 0.00 <0.03;<0.03; <0.03 hgb 12.9, WBC 5.3 Plts 234 Na 142, K+ 4.7; Cr 1.05 2V CXR  No active cardiopulmonary disease. CTA of chest 12/28/17 with no PE, no thoracic aneurysm or dissection, 3 mm nodular opacity apical segment right upper lobe. No follow-up needed if patient is low-risk. Non-contrast chest CT can be considered in 12 months if patient is high-risk  Currently no further chest pain at rest.  Pt and wife are very concerned.    BP is well controlled.    Past Medical History:  Diagnosis Date  . High cholesterol     History reviewed. No pertinent surgical history.   Home Medications:  Prior to Admission medications   Medication Sig Start Date End Date Taking? Authorizing Provider  acetaminophen (TYLENOL) 500 MG tablet Take 1,000 mg by mouth at bedtime as needed for moderate pain.   Yes [provider]  Cholecalciferol (VITAMIN D PO) Take 1,000 Units by mouth daily.   Yes [provider]  Thiamine HCl (VITAMIN B-1  PO) Take 1 tablet by mouth daily.   Yes [provider]  VITAMIN E PO Take 1 tablet by mouth daily.    Yes [provider]    Inpatient Medications: Scheduled Meds: . enoxaparin (LOVENOX) injection  40 mg Subcutaneous QHS   Continuous Infusions:  PRN Meds: acetaminophen, ALPRAZolam, morphine injection, ondansetron (ZOFRAN) IV  Allergies:    Allergies  Allergen Reactions  . Penicillins     Has patient had a PCN reaction causing immediate rash, facial/tongue/throat swelling, SOB or lightheadedness with hypotension: /No Has patient had a PCN reaction causing severe rash involving mucus membranes or skin necrosis: /No Has patient had a PCN reaction that required hospitalization: No Has patient had a PCN reaction occurring within the last 10 years: /No If all of the above answers are "NO", then may proceed with Cephalosporin use.   Marland Kitchen Amoxicillin Rash    Has patient had a PCN reaction causing immediate rash, facial/tongue/throat swelling, SOB or lightheadedness with hypotension: no Has patient had a PCN reaction causing severe rash involving mucus membranes or skin necrosis: No Has patient had a PCN reaction that required hospitalization: {No Has patient had a PCN reaction occurring within the last 10 years: No/ If all of the above answers are "NO", then may proceed with Cephalosporin use.     Social History:   Social History   Socioeconomic History  . Marital status: Married    Spouse name:  Not on file  . Number of children: Not on file  . Years of education: Not on file  . Highest education level: Not on file  Occupational History  . 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 Financial resource strain: Not on file  . Food insecurity:    Worry: Not on file    Inability: Not on file  . Transportation needs:    Medical: Not on file    Non-medical: Not on file  Tobacco Use  . Smoking status: Never Smoker  .  Smokeless tobacco: Never Used  Substance and Sexual Activity  . Alcohol use: Yes  . Drug use: No  . Sexual activity: Not on file  Lifestyle  . Physical activity:    Days per week: Not on file    Minutes per session: Not on file  . Stress: Not on file  Relationships  . Social connections:    Talks on phone: Not on file    Gets together: Not on file    Attends religious service: Not on file    Active member of club or organization: Not on file    Attends meetings of clubs or organizations: Not on file    Relationship status: Not on file  . Intimate partner violence:    Fear of current or ex partner: Not on file    Emotionally abused: Not on file    Physically abused: Not on file    Forced sexual activity: Not on file  Other Topics Concern  . Not on file  Social History Narrative  . Not on file    Family History:    Family History  Problem Relation Age of Onset  . Diabetes Mother   . Hypertension Father   . Healthy Sister   . Healthy Brother      ROS:  Please see the history of present illness.  General:no colds or fevers, no weight changes Skin:no rashes or ulcers HEENT:no blurred vision, no congestion CV:see HPI PUL:see HPI GI:no diarrhea constipation or melena, no indigestion GU:no hematuria, no dysuria MS:no joint pain, no claudication Neuro:no syncope, no lightheadedness Endo:no diabetes, no thyroid disease  All other ROS reviewed and negative.     Physical Exam/Data:   Vitals:   12/28/17 2300 12/28/17 2330 12/29/17 0025 12/29/17 0511  BP: 113/72 107/70 115/73 (!) 105/57  Pulse: (!) 59 (!) 57 62 (!) 57  Resp: 16 13 18 18   Temp:   98 F (36.7 C) 98 F (36.7 C)  TempSrc:   Oral Oral  SpO2: 97% 98% 100% 99%  Weight:   176 lb 3.2 oz (79.9 kg)   Height:   5\' 6"  (1.676 m)    No intake or output data in the 24 hours ending 12/29/17 1115 Filed Weights   12/28/17 1503 12/29/17 0025  Weight: 172 lb (78 kg) 176 lb 3.2 oz (79.9 kg)   Body mass index is  28.44 kg/m.  General:  Well nourished, well developed, in no acute distress- working on Animator. HEENT: normal Lymph: no adenopathy Neck: no JVD Endocrine:  No thryomegaly Vascular: No carotid bruits;Pedal pulses 2+ bilaterally without bruits  Cardiac:  normal S1, S2; RRR; no murmur, gallup, rub or click   Lungs:  clear to auscultation bilaterally, no wheezing, rhonchi or rales  Abd: soft, nontender, no hepatomegaly  Ext: no edema Musculoskeletal:  No deformities, BUE and BLE strength normal and equal Skin: warm and  dry  Neuro:  Alert and oriented X 3 MAE follows commands, no focal abnormalities noted Psych:  Normal affect    Relevant CV Studies: No prior studies.  Laboratory Data:  Chemistry Recent Labs  Lab 12/28/17 1525 12/29/17 0044  NA 142  --   K 4.7  --   CL 107  --   CO2 27  --   GLUCOSE 101*  --   BUN 14  --   CREATININE 1.05 0.99  CALCIUM 9.5  --   GFRNONAA >60 >60  GFRAA >60 >60  ANIONGAP 8  --     No results for input(s): PROT, ALBUMIN, AST, ALT, ALKPHOS, BILITOT in the last 168 hours. Hematology Recent Labs  Lab 12/28/17 1525 12/29/17 0044  WBC 6.2 5.3  RBC 4.89 4.63  HGB 13.7 12.9*  HCT 41.9 39.7  MCV 85.7 85.7  MCH 28.0 27.9  MCHC 32.7 32.5  RDW 12.8 13.1  PLT 251 234   Cardiac Enzymes Recent Labs  Lab 12/29/17 0044 12/29/17 0252 12/29/17 0627  TROPONINI <0.03 <0.03 <0.03    Recent Labs  Lab 12/28/17 1532  TROPIPOC 0.00    BNPNo results for input(s): BNP, PROBNP in the last 168 hours.  DDimer No results for input(s): DDIMER in the last 168 hours.  Radiology/Studies:  Dg Chest 2 View  Result Date: 12/28/2017 CLINICAL DATA:  Chest pain and shortness of breath. EXAM: CHEST - 2 VIEW COMPARISON:  Chest x-ray dated June 16, 2012. FINDINGS: The heart size and mediastinal contours are within normal limits. Both lungs are clear. The visualized skeletal structures are unremarkable. IMPRESSION: No active cardiopulmonary disease.  Electronically Signed   By: Obie Dredge M.D.   On: 12/28/2017 15:25   Ct Angio Chest Pe W/cm &/or Wo Cm  Result Date: 12/28/2017 CLINICAL DATA:  Shortness of breath EXAM: CT ANGIOGRAPHY CHEST WITH CONTRAST TECHNIQUE: Multidetector CT imaging of the chest was performed using the standard protocol during bolus administration of intravenous contrast. Multiplanar CT image reconstructions and MIPs were obtained to evaluate the vascular anatomy. CONTRAST:  ISOVUE-370 IOPAMIDOL (ISOVUE-370) INJECTION 76% COMPARISON:  Chest radiograph December 28, 2017 FINDINGS: Cardiovascular: There is no demonstrable pulmonary embolus. There is no appreciable thoracic aortic aneurysm or dissection. Visualized great vessels appear unremarkable. No pericardial effusion or pericardial thickening is evident. Mediastinum/Nodes: Thyroid appears normal. There is no appreciable thoracic adenopathy. No esophageal lesions are evident. Lungs/Pleura: There is a 3 mm nodular opacity in the apical segment of the right upper lobe seen on axial slice 23 series 11 and coronal slice 97 series 7. There is no edema or consolidation. No pleural effusion or pleural thickening evident. Upper Abdomen: Visualized upper abdominal structures appear normal. Musculoskeletal: There are no blastic or lytic bone lesions. There are no appreciable chest wall lesions. Review of the MIP images confirms the above findings. IMPRESSION: 1. No demonstrable pulmonary embolus. No thoracic aortic aneurysm or dissection. 2. 3 mm nodular opacity apical segment right upper lobe. No follow-up needed if patient is low-risk. Non-contrast chest CT can be considered in 12 months if patient is high-risk. This recommendation follows the consensus statement: Guidelines for Management of Incidental Pulmonary Nodules Detected on CT Images: From the Fleischner Society 2017; Radiology 2017; 284:228-243. 3.  No edema or consolidation.  No pleural effusion. 4.  No appreciable thoracic  adenopathy. Electronically Signed   By: Bretta Bang III M.D.   On: 12/28/2017 21:32    Assessment and Plan:   1. Exertional angina  with neg troponins and stable EKG.  Hx HLD,  Dr. Duke Salviaandolph to see.  Possible exercise myoview vs cardiac CTA.  Discussed with Dr. Duke Salviaandolph and will do exercise myoivew.  CT scanner has not room for today.  2. HLD hx awaiting results of lipid panel.    For questions or updates, please contact [facility] Please consult [email or web address] for contact info under Cardiology/STEMI.   Signed, Nada BoozerLaura Ingold, NP  12/29/2017 11:15 AM

## 2017-12-29 NOTE — Progress Notes (Signed)
Dr. Duke Salviaandolph reviewed nuc study no ischemia.  She would like to have pt obtain echo as outpt and scheduled for the 19th at 0730 AM  Instructions in d/c appts.

## 2017-12-29 NOTE — Progress Notes (Signed)
Spoke with pt concerning PCP, pt states that he will call Dr. Nehemiah SettlePolite for an appointment. Pt is very knowledgeable of needing a PCP. There are no other needs at present time.

## 2017-12-29 NOTE — Progress Notes (Signed)
[facility] contacted and [facility]

## 2017-12-29 NOTE — Progress Notes (Signed)
[facility]

## 2017-12-29 NOTE — Progress Notes (Signed)
CSW consulted for patient needs PCP. CSW informed patient's RNCM about consult for patient needing PCP. CSW signing off, no other needs identified at this time.  Celso SickleKimberly Caterina [place], [place] Clinical Social Worker [facility] Cell#: [phone]

## 2018-01-02 ENCOUNTER — Other Ambulatory Visit (HOSPITAL_COMMUNITY): Payer: 59

## 2018-01-06 ENCOUNTER — Ambulatory Visit (HOSPITAL_COMMUNITY): Payer: 59 | Attending: Cardiovascular Disease

## 2018-01-06 ENCOUNTER — Other Ambulatory Visit: Payer: Self-pay

## 2018-01-06 DIAGNOSIS — E785 Hyperlipidemia, unspecified: Secondary | ICD-10-CM | POA: Insufficient documentation

## 2018-01-06 DIAGNOSIS — I509 Heart failure, unspecified: Secondary | ICD-10-CM | POA: Diagnosis not present

## 2018-01-06 DIAGNOSIS — R079 Chest pain, unspecified: Secondary | ICD-10-CM | POA: Diagnosis not present

## 2018-01-08 NOTE — Progress Notes (Signed)
Pt has been made aware of normal result and verbalized understanding.  jw 01/08/18

## 2018-01-09 DIAGNOSIS — R002 Palpitations: Secondary | ICD-10-CM | POA: Diagnosis not present

## 2018-01-09 DIAGNOSIS — J302 Other seasonal allergic rhinitis: Secondary | ICD-10-CM | POA: Diagnosis not present

## 2018-01-09 DIAGNOSIS — H6503 Acute serous otitis media, bilateral: Secondary | ICD-10-CM | POA: Diagnosis not present

## 2018-01-09 DIAGNOSIS — R42 Dizziness and giddiness: Secondary | ICD-10-CM | POA: Diagnosis not present

## 2018-01-09 DIAGNOSIS — Z789 Other specified health status: Secondary | ICD-10-CM | POA: Diagnosis not present

## 2018-02-11 ENCOUNTER — Ambulatory Visit: Payer: 59 | Admitting: Cardiovascular Disease

## 2018-02-11 ENCOUNTER — Encounter: Payer: Self-pay | Admitting: Cardiovascular Disease

## 2018-02-11 VITALS — BP 110/88 | HR 67 | Ht 67.0 in | Wt 181.2 lb

## 2018-02-11 DIAGNOSIS — H9202 Otalgia, left ear: Secondary | ICD-10-CM | POA: Insufficient documentation

## 2018-02-11 DIAGNOSIS — R079 Chest pain, unspecified: Secondary | ICD-10-CM

## 2018-02-11 DIAGNOSIS — I493 Ventricular premature depolarization: Secondary | ICD-10-CM | POA: Diagnosis not present

## 2018-02-11 HISTORY — DX: Otalgia, left ear: H92.02

## 2018-02-11 NOTE — Patient Instructions (Signed)
Medication Instructions:  .Your physician recommends that you continue on your current medications as directed. Please refer to the Current Medication list given to you today.  Labwork: none  Testing/Procedures: none  Follow-Up: As needed   

## 2018-02-11 NOTE — Progress Notes (Signed)
Cardiology Office Note   Date:  02/11/2018   ID:  Jason Harris, DOB 1971/06/21, MRN [id number]  PCP:  Patient, No Pcp Per  Cardiologist:   Chilton Si, MD   No chief complaint on file.     History of Present Illness: Jason Harris is a 47 y.o. male with hyperlipidemia who presents for follow up.  He was seen in the ED 12/2017 with chest pain that occurred with exertion.  Cardiac enzymes were negative.  EKG revealed sinus rhythm with early repolarization abnormalities unchanged from 2013.   He underwent exercise Myoview and was noted to have occasional PVCs.  ETT was negative for ischemia and he achieved 10.4 METs on a Bruce protocol.  There was no evidence of ischemia on his Myoview.  However his LVEF was noted to be 49%.  Given that he had no symptoms of heart failure and no signs of heart failure on exam he had an outpatient echo 12/2017 that revealed LVEF 55-60%.  Since his last appointment Mr. Jason Harris has been feeling well.  He has not expands any recurrent chest pain or pressure.  He exercises several days per week by walking 4 to 5 miles or riding his bike.  He has no exertional symptoms.  Initially after hospitalization he felt occasional palpitations but these have subsided.  He occasionally has lower extremity edema after eating a salty meal.  This improves with elevation of his legs.  He denies orthopnea or PND.  He notes that since his nuclear stress test his taste buds have changed.  He has been eating more meat lately and has gained 5 pounds since his hospitalization.  He also reports pain in his left ear that began 1 day ago.  He had low-grade fevers.  He denies cough or dysuria.   Past Medical History:  Diagnosis Date  . Ear pain, left 02/11/2018  . High cholesterol     Past Surgical History:  Procedure Laterality Date  . TONSILLECTOMY     as a kid     Current Outpatient Medications  Medication Sig Dispense Refill  . acetaminophen (TYLENOL) 500 MG  tablet Take 1,000 mg by mouth at bedtime as needed for moderate pain.    . montelukast (SINGULAIR) 10 MG tablet Take 1 tablet by mouth as needed.    . Multiple Vitamins-Minerals (CENTRUM ADULTS) TABS Take 1 tablet by mouth daily.     No current facility-administered medications for this visit.     Allergies:   Gentamycin [gentamicin]; Penicillins; and Amoxicillin    Social History:  The patient  reports that he has never smoked. He has never used smokeless tobacco. He reports that he drinks alcohol. He reports that he does not use drugs.   Family History:  The patient's family history includes Cancer in his maternal grandfather; Diabetes in his mother; Healthy in his brother and sister; Heart attack in his paternal grandfather; Hypertension in his father; Renal Disease in his maternal grandmother.    ROS:  Please see the history of present illness.   Otherwise, review of systems are positive for none.   All other systems are reviewed and negative.    PHYSICAL EXAM: VS:  BP 110/88   Pulse 67   Ht  (1.702 m)   Wt 181 lb 3.2 oz (82.2 kg)   BMI 28.38 kg/m  , BMI Body mass index is 28.38 kg/m. GENERAL:  Well appearing HEENT:  Pupils equal round and reactive, fundi not visualized, oral  mucosa unremarkable.  TMs without fluid or erythma bilaterally.  NECK:  No jugular venous distention, waveform within normal limits, carotid upstroke brisk and symmetric, no bruits, no thyromegaly LYMPHATICS:  No cervical adenopathy LUNGS:  Clear to auscultation bilaterally HEART:  RRR.  PMI not displaced or sustained,S1 and S2 within normal limits, no S3, no S4, no clicks, no rubs, no murmurs ABD:  Flat, positive bowel sounds normal in frequency in pitch, no bruits, no rebound, no guarding, no midline pulsatile mass, no hepatomegaly, no splenomegaly EXT:  2 plus pulses throughout, no edema, no cyanosis no clubbing SKIN:  No rashes no nodules NEURO:  Cranial nerves II through XII grossly intact, motor  grossly intact throughout PSYCH:  Cognitively intact, oriented to person place and time   EKG:  EKG is not ordered today.   Echo 01/06/18: Study Conclusions  - Left ventricle: The cavity size was normal. Wall thickness was   normal. Systolic function was normal. The estimated ejection   fraction was in the range of 55% to 60%. Wall motion was normal;   there were no regional wall motion abnormalities. There was an   increased relative contribution of atrial contraction to   ventricular filling.  Lexiscan Myoview 12/29/17: IMPRESSION: 1. No reversible ischemia or infarction.  2. Normal left ventricular wall motion.  3. Left ventricular ejection fraction 49%  4. Non invasive risk stratification*: Low   Recent Labs: 12/28/2017: BUN 14; Potassium 4.7; Sodium 142 12/29/2017: Creatinine, Ser 0.99; Hemoglobin 12.9; Platelets 234    Lipid Panel No results found for: CHOL, TRIG, HDL, CHOLHDL, VLDL, LDLCALC, LDLDIRECT    Wt Readings from Last 3 Encounters:  02/11/18 181 lb 3.2 oz (82.2 kg)  12/29/17 176 lb 3.2 oz (79.9 kg)      ASSESSMENT AND PLAN:  # Exertional chest pain: Resolved.  Lexiscan Myoview was negative for ischemia.  # PVCs: Resolved.  # L ear pain: No evidence of infection on exam.    Current medicines are reviewed at length with the patient today.  The patient does not have concerns regarding medicines.  The following changes have been made:  no change  Labs/ tests ordered today include:  No orders of the defined types were placed in this encounter.    Disposition:   FU with Jason Manring C. Jason Salvia, MD, [facility] as needed.     This note was written with the assistance of speech recognition software.  Please excuse any transcriptional errors.  Signed, Jazmon Kos C. Jason Salvia, MD, [facility]  02/11/2018 9:26 AM    [facility]

## 2018-09-11 IMAGING — CT CT ANGIO CHEST
2 of 6 series · 18 of 46 positions shown · IV contrast (ISOVUE)
Comparison: Chest radiograph December 28, 2017

CLINICAL DATA: Shortness of breath

EXAM:
CT ANGIOGRAPHY CHEST WITH CONTRAST
TECHNIQUE: Multidetector CT imaging of the chest was performed using the
standard protocol during bolus administration of intravenous
contrast. Multiplanar CT image reconstructions and MIPs were
obtained to evaluate the vascular anatomy.
CONTRAST:  100mL [id number] IOPAMIDOL ([id number]) INJECTION 76%

[Series 6: thins · axial · 0.73mm/px · z∈[+1577,+1837]mm · 15 of 286 slices shown]
[im 13/286  lung]
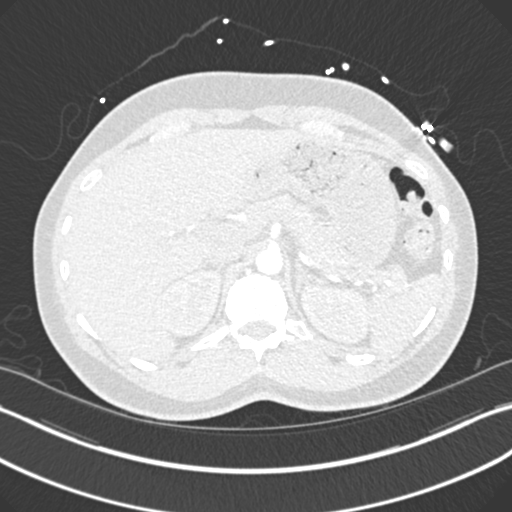
[im 38/286  soft-tissue]
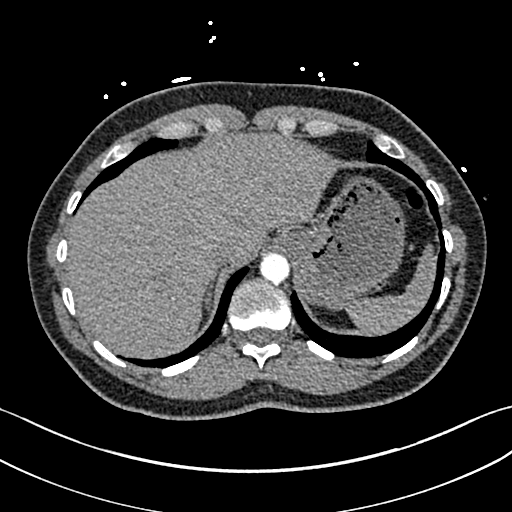
[im 50/286  lung]
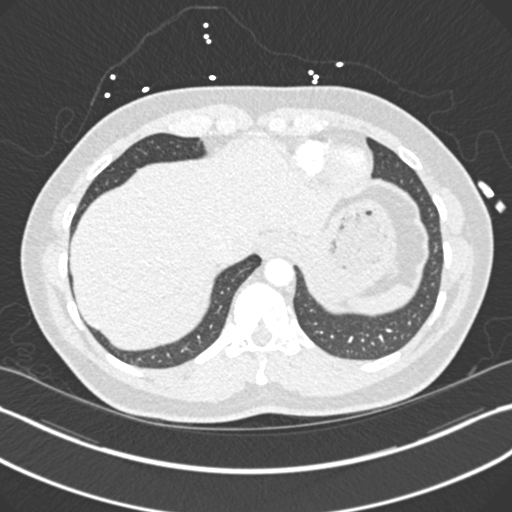
[im 75/286  soft-tissue]
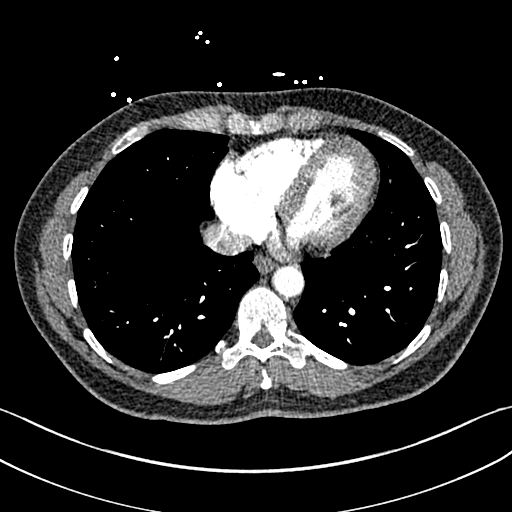
[im 87/286  lung]
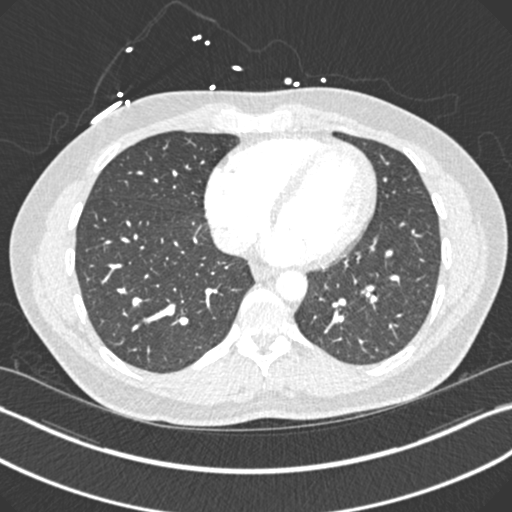
[im 112/286  soft-tissue]
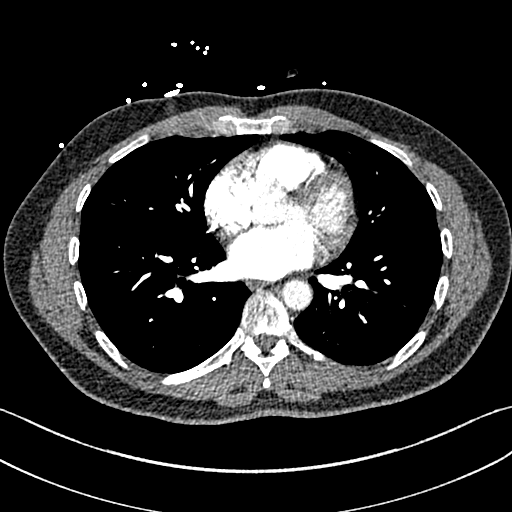
[im 124/286  lung]
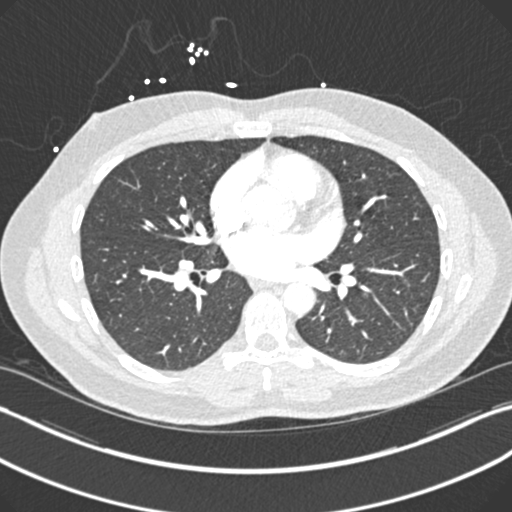
[im 149/286  soft-tissue]
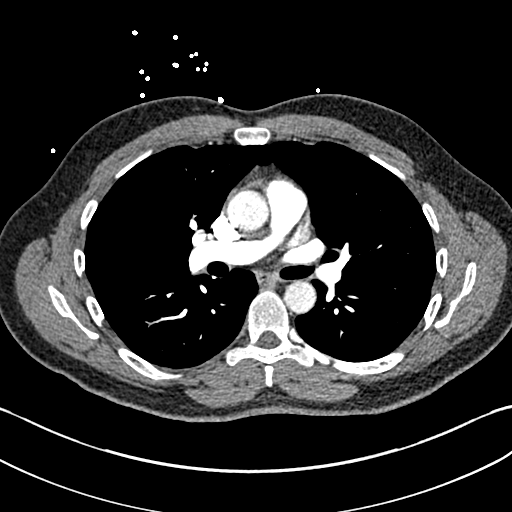
[im 162/286  lung]
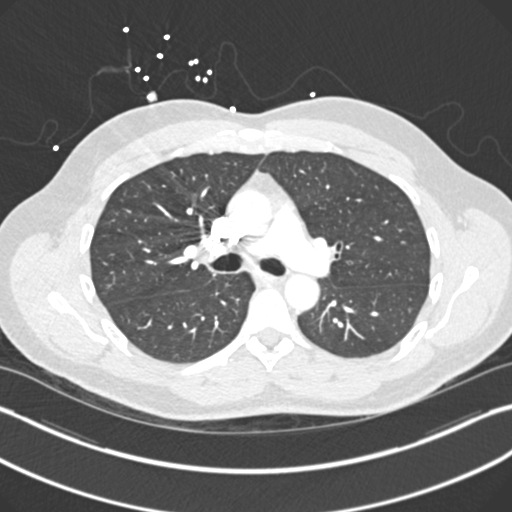
[im 174/286  soft-tissue]
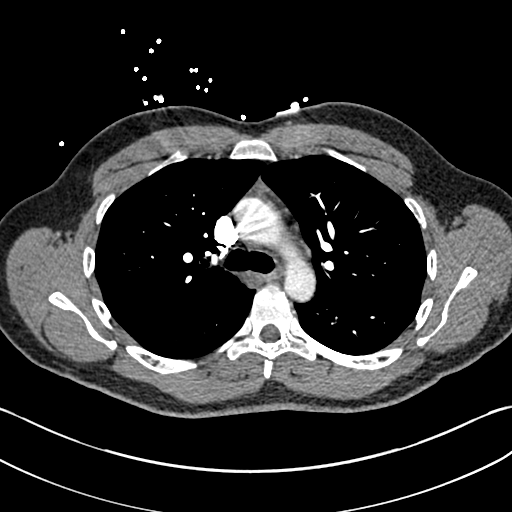
[im 199/286  lung]
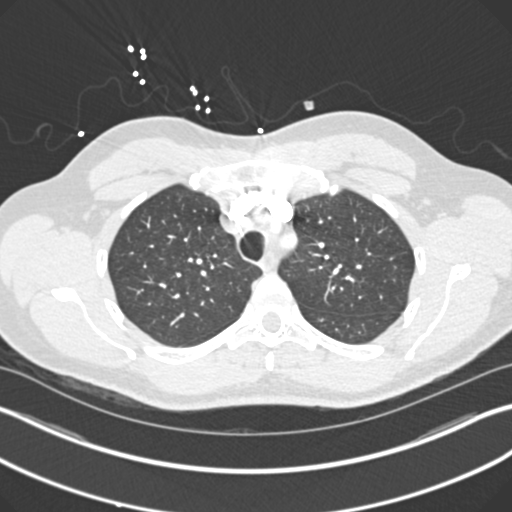
[im 211/286  soft-tissue]
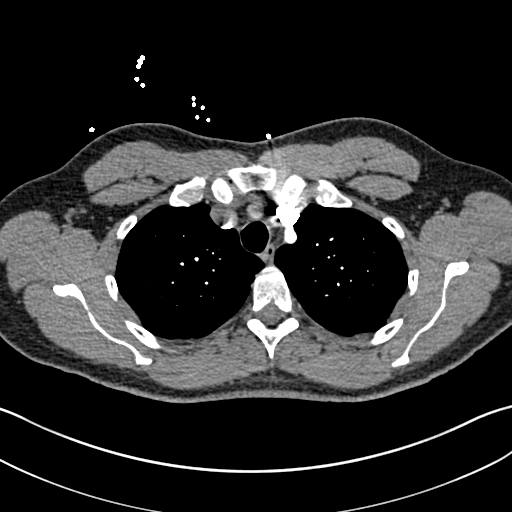
[im 236/286  lung]
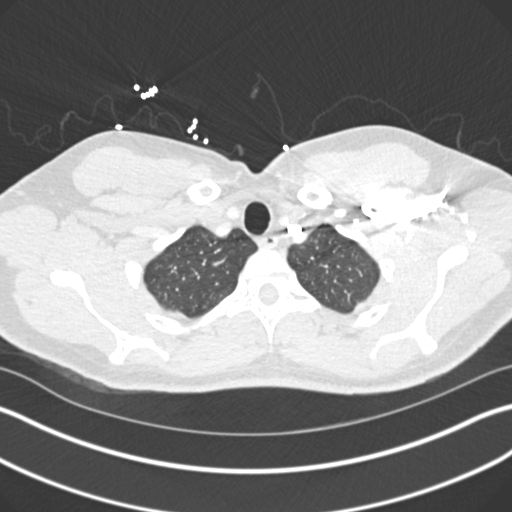
[im 248/286  soft-tissue]
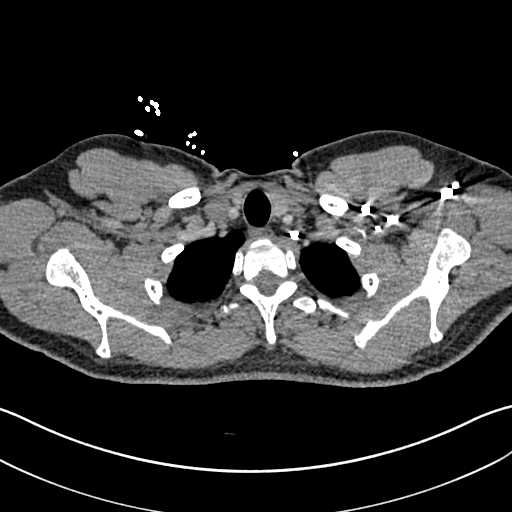
[im 273/286  lung]
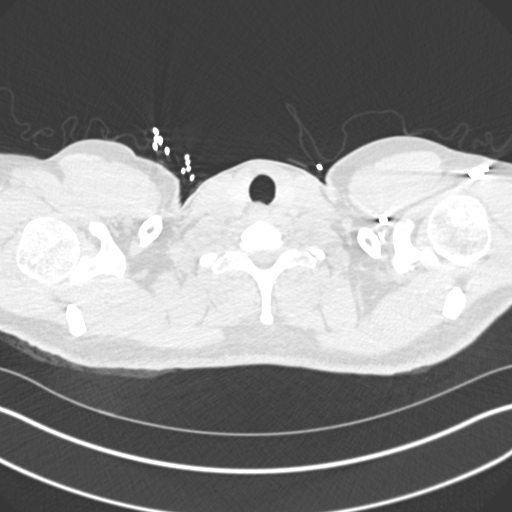

[Series 7: coronal mpr · coronal · 0.60mm/px · 3 of 151 slices shown]
[im 38/151  soft-tissue]
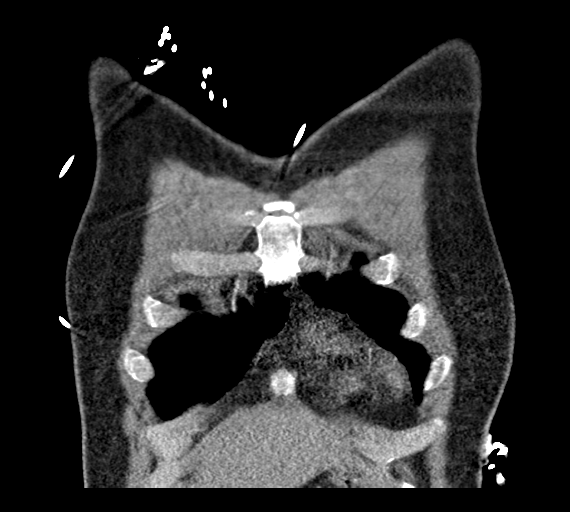
[im 76/151  soft-tissue]
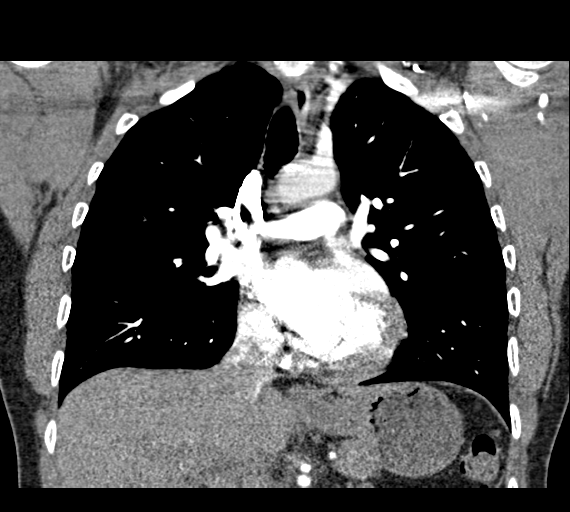
[im 113/151  soft-tissue]
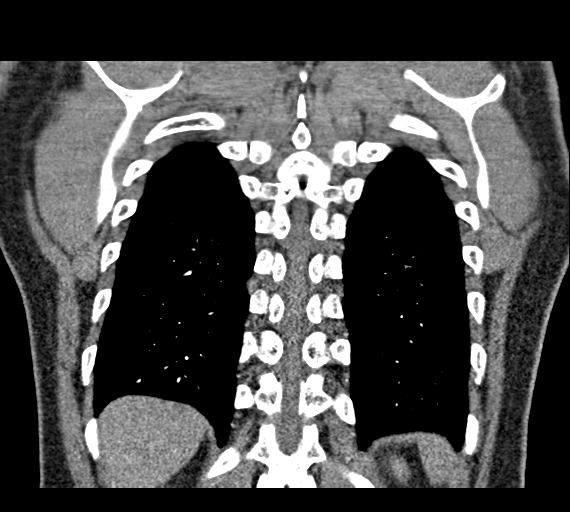

[18 of 46 positions shown; findings below may reference images not displayed]

FINDINGS: Cardiovascular: There is no demonstrable pulmonary embolus. There is
no appreciable thoracic aortic aneurysm or dissection. Visualized
great vessels appear unremarkable. No pericardial effusion or
pericardial thickening is evident.

Mediastinum/Nodes: Thyroid appears normal. There is no appreciable
thoracic adenopathy. No esophageal lesions are evident.

Lungs/Pleura: There is a 3 mm nodular opacity in the apical segment
of the right upper lobe seen on axial slice 23 series 11 and coronal
slice 97 series 7. There is no edema or consolidation. No pleural
effusion or pleural thickening evident.

Upper Abdomen: Visualized upper abdominal structures appear normal.

Musculoskeletal: There are no blastic or lytic bone lesions. There
are no appreciable chest wall lesions.

Review of the MIP images confirms the above findings.
IMPRESSION: 1. No demonstrable pulmonary embolus. No thoracic aortic aneurysm or
dissection.

2. 3 mm nodular opacity apical segment right upper lobe. No
follow-up needed if patient is low-risk. Non-contrast chest CT can
be considered in 12 months if patient is high-risk. This
recommendation follows the consensus statement: Guidelines for
Management of Incidental Pulmonary Nodules Detected on CT Images:

3.  No edema or consolidation.  No pleural effusion.

4.  No appreciable thoracic adenopathy.

## 2018-09-16 DIAGNOSIS — H524 Presbyopia: Secondary | ICD-10-CM | POA: Diagnosis not present

## 2019-02-23 ENCOUNTER — Other Ambulatory Visit: Payer: Self-pay | Admitting: *Deleted

## 2019-02-23 DIAGNOSIS — Z20822 Contact with and (suspected) exposure to covid-19: Secondary | ICD-10-CM

## 2019-03-04 NOTE — Addendum Note (Signed)
Addended by: Zyier Dykema M on: 03/04/2019 11:50 AM   Modules accepted: Orders  

## 2019-03-08 ENCOUNTER — Other Ambulatory Visit: Payer: Self-pay | Admitting: *Deleted

## 2019-03-08 DIAGNOSIS — Z20822 Contact with and (suspected) exposure to covid-19: Secondary | ICD-10-CM

## 2019-03-11 NOTE — Addendum Note (Signed)
Addended by: Bodee Lafoe M on: 03/11/2019 03:47 PM   Modules accepted: Orders  

## 2019-06-03 DIAGNOSIS — E785 Hyperlipidemia, unspecified: Secondary | ICD-10-CM | POA: Diagnosis not present

## 2019-06-03 DIAGNOSIS — Z87898 Personal history of other specified conditions: Secondary | ICD-10-CM | POA: Diagnosis not present

## 2019-06-03 DIAGNOSIS — R200 Anesthesia of skin: Secondary | ICD-10-CM | POA: Diagnosis not present

## 2019-06-03 DIAGNOSIS — D649 Anemia, unspecified: Secondary | ICD-10-CM | POA: Diagnosis not present

## 2019-06-03 DIAGNOSIS — Z2821 Immunization not carried out because of patient refusal: Secondary | ICD-10-CM | POA: Diagnosis not present

## 2019-06-07 DIAGNOSIS — Z87898 Personal history of other specified conditions: Secondary | ICD-10-CM | POA: Diagnosis not present

## 2019-06-07 DIAGNOSIS — D649 Anemia, unspecified: Secondary | ICD-10-CM | POA: Diagnosis not present

## 2019-06-07 DIAGNOSIS — E785 Hyperlipidemia, unspecified: Secondary | ICD-10-CM | POA: Diagnosis not present

## 2019-06-07 DIAGNOSIS — R200 Anesthesia of skin: Secondary | ICD-10-CM | POA: Diagnosis not present

## 2019-06-25 DIAGNOSIS — Z20828 Contact with and (suspected) exposure to other viral communicable diseases: Secondary | ICD-10-CM | POA: Diagnosis not present

## 2019-06-25 DIAGNOSIS — R0981 Nasal congestion: Secondary | ICD-10-CM | POA: Diagnosis not present

## 2019-06-25 DIAGNOSIS — R519 Headache, unspecified: Secondary | ICD-10-CM | POA: Diagnosis not present

## 2020-03-22 ENCOUNTER — Other Ambulatory Visit (HOSPITAL_COMMUNITY): Payer: Self-pay | Admitting: Internal Medicine

## 2020-03-22 DIAGNOSIS — R1310 Dysphagia, unspecified: Secondary | ICD-10-CM | POA: Diagnosis not present

## 2020-03-22 DIAGNOSIS — K2100 Gastro-esophageal reflux disease with esophagitis, without bleeding: Secondary | ICD-10-CM | POA: Diagnosis not present

## 2020-03-22 MED FILL — PANTOPRAZOLE SOD DR 40 MG T: 40 | 30 days supply | Qty: 30 | Fill #0

## 2020-04-17 MED FILL — PANTOPRAZOLE SOD DR 40 MG T: 40 | 30 days supply | Qty: 30 | Fill #0

## 2020-04-20 DIAGNOSIS — H524 Presbyopia: Secondary | ICD-10-CM | POA: Diagnosis not present

## 2020-04-27 DIAGNOSIS — K2100 Gastro-esophageal reflux disease with esophagitis, without bleeding: Secondary | ICD-10-CM | POA: Diagnosis not present

## 2020-04-27 DIAGNOSIS — R1310 Dysphagia, unspecified: Secondary | ICD-10-CM | POA: Diagnosis not present

## 2020-05-30 DIAGNOSIS — B974 Respiratory syncytial virus as the cause of diseases classified elsewhere: Secondary | ICD-10-CM | POA: Diagnosis not present

## 2020-05-30 DIAGNOSIS — J101 Influenza due to other identified influenza virus with other respiratory manifestations: Secondary | ICD-10-CM | POA: Diagnosis not present

## 2020-05-30 DIAGNOSIS — Z20828 Contact with and (suspected) exposure to other viral communicable diseases: Secondary | ICD-10-CM | POA: Diagnosis not present

## 2020-05-30 DIAGNOSIS — U071 COVID-19: Secondary | ICD-10-CM | POA: Diagnosis not present

## 2020-06-28 MED FILL — PANTOPRAZOLE SOD DR 40 MG T: 40 | 30 days supply | Qty: 30 | Fill #1

## 2020-07-13 DIAGNOSIS — K2950 Unspecified chronic gastritis without bleeding: Secondary | ICD-10-CM | POA: Diagnosis not present

## 2020-07-13 DIAGNOSIS — K2100 Gastro-esophageal reflux disease with esophagitis, without bleeding: Secondary | ICD-10-CM | POA: Diagnosis not present

## 2020-07-13 DIAGNOSIS — R1310 Dysphagia, unspecified: Secondary | ICD-10-CM | POA: Diagnosis not present

## 2020-07-13 DIAGNOSIS — D649 Anemia, unspecified: Secondary | ICD-10-CM | POA: Diagnosis not present

## 2020-07-13 DIAGNOSIS — K2980 Duodenitis without bleeding: Secondary | ICD-10-CM | POA: Diagnosis not present

## 2020-07-13 DIAGNOSIS — R12 Heartburn: Secondary | ICD-10-CM | POA: Diagnosis not present

## 2020-07-13 DIAGNOSIS — K219 Gastro-esophageal reflux disease without esophagitis: Secondary | ICD-10-CM | POA: Diagnosis not present

## 2020-07-13 DIAGNOSIS — K209 Esophagitis, unspecified without bleeding: Secondary | ICD-10-CM | POA: Diagnosis not present

## 2020-07-13 DIAGNOSIS — K3189 Other diseases of stomach and duodenum: Secondary | ICD-10-CM | POA: Diagnosis not present

## 2020-07-13 DIAGNOSIS — K317 Polyp of stomach and duodenum: Secondary | ICD-10-CM | POA: Diagnosis not present

## 2020-07-24 ENCOUNTER — Other Ambulatory Visit (HOSPITAL_COMMUNITY): Payer: Self-pay

## 2020-07-24 DIAGNOSIS — M7661 Achilles tendinitis, right leg: Secondary | ICD-10-CM | POA: Diagnosis not present

## 2020-07-24 MED FILL — MELOXICAM 15 MG TABLET: 15 | 30 days supply | Qty: 30 | Fill #0

## 2020-07-25 DIAGNOSIS — Z0000 Encounter for general adult medical examination without abnormal findings: Secondary | ICD-10-CM | POA: Diagnosis not present

## 2020-07-25 DIAGNOSIS — K219 Gastro-esophageal reflux disease without esophagitis: Secondary | ICD-10-CM | POA: Diagnosis not present

## 2020-07-25 DIAGNOSIS — Z87438 Personal history of other diseases of male genital organs: Secondary | ICD-10-CM | POA: Diagnosis not present

## 2020-07-25 DIAGNOSIS — E782 Mixed hyperlipidemia: Secondary | ICD-10-CM | POA: Diagnosis not present

## 2020-07-25 DIAGNOSIS — Z125 Encounter for screening for malignant neoplasm of prostate: Secondary | ICD-10-CM | POA: Diagnosis not present

## 2020-07-25 DIAGNOSIS — Z79899 Other long term (current) drug therapy: Secondary | ICD-10-CM | POA: Diagnosis not present

## 2020-07-25 DIAGNOSIS — E559 Vitamin D deficiency, unspecified: Secondary | ICD-10-CM | POA: Diagnosis not present

## 2020-07-25 DIAGNOSIS — R945 Abnormal results of liver function studies: Secondary | ICD-10-CM | POA: Diagnosis not present

## 2020-07-25 DIAGNOSIS — R200 Anesthesia of skin: Secondary | ICD-10-CM | POA: Diagnosis not present

## 2020-07-25 DIAGNOSIS — R252 Cramp and spasm: Secondary | ICD-10-CM | POA: Diagnosis not present

## 2020-07-25 DIAGNOSIS — D649 Anemia, unspecified: Secondary | ICD-10-CM | POA: Diagnosis not present

## 2020-08-24 DIAGNOSIS — H9313 Tinnitus, bilateral: Secondary | ICD-10-CM | POA: Diagnosis not present

## 2020-08-24 DIAGNOSIS — R0982 Postnasal drip: Secondary | ICD-10-CM | POA: Diagnosis not present

## 2020-08-24 DIAGNOSIS — H9201 Otalgia, right ear: Secondary | ICD-10-CM | POA: Diagnosis not present

## 2020-08-24 MED FILL — FLUTICASONE PROP 50 MCG SPR: 50 | 30 days supply | Qty: 16 | Fill #0

## 2020-08-31 DIAGNOSIS — J101 Influenza due to other identified influenza virus with other respiratory manifestations: Secondary | ICD-10-CM | POA: Diagnosis not present

## 2020-08-31 DIAGNOSIS — B974 Respiratory syncytial virus as the cause of diseases classified elsewhere: Secondary | ICD-10-CM | POA: Diagnosis not present

## 2020-08-31 DIAGNOSIS — U071 COVID-19: Secondary | ICD-10-CM | POA: Diagnosis not present

## 2020-08-31 DIAGNOSIS — Z20828 Contact with and (suspected) exposure to other viral communicable diseases: Secondary | ICD-10-CM | POA: Diagnosis not present

## 2021-03-28 ENCOUNTER — Other Ambulatory Visit (HOSPITAL_COMMUNITY): Payer: Self-pay

## 2021-03-28 DIAGNOSIS — J069 Acute upper respiratory infection, unspecified: Secondary | ICD-10-CM | POA: Diagnosis not present

## 2021-03-28 DIAGNOSIS — J029 Acute pharyngitis, unspecified: Secondary | ICD-10-CM | POA: Diagnosis not present

## 2021-03-28 MED ORDER — BENZONATATE 100 MG PO CAPS
ORAL_CAPSULE | ORAL | 0 refills | Status: AC
  Filled 2021-03-28: qty 30, 10d supply, fill #0

## 2021-04-06 ENCOUNTER — Other Ambulatory Visit (HOSPITAL_COMMUNITY): Payer: Self-pay

## 2021-04-12 DIAGNOSIS — R5383 Other fatigue: Secondary | ICD-10-CM | POA: Diagnosis not present

## 2021-04-12 DIAGNOSIS — Z79890 Hormone replacement therapy (postmenopausal): Secondary | ICD-10-CM | POA: Diagnosis not present

## 2021-04-12 DIAGNOSIS — E291 Testicular hypofunction: Secondary | ICD-10-CM | POA: Diagnosis not present

## 2021-04-18 DIAGNOSIS — N529 Male erectile dysfunction, unspecified: Secondary | ICD-10-CM | POA: Diagnosis not present

## 2021-04-18 DIAGNOSIS — E785 Hyperlipidemia, unspecified: Secondary | ICD-10-CM | POA: Diagnosis not present

## 2021-04-18 DIAGNOSIS — E291 Testicular hypofunction: Secondary | ICD-10-CM | POA: Diagnosis not present

## 2021-04-18 DIAGNOSIS — R209 Unspecified disturbances of skin sensation: Secondary | ICD-10-CM | POA: Diagnosis not present

## 2021-04-18 DIAGNOSIS — Z6828 Body mass index (BMI) 28.0-28.9, adult: Secondary | ICD-10-CM | POA: Diagnosis not present

## 2021-04-18 DIAGNOSIS — R5383 Other fatigue: Secondary | ICD-10-CM | POA: Diagnosis not present

## 2021-04-23 DIAGNOSIS — N529 Male erectile dysfunction, unspecified: Secondary | ICD-10-CM | POA: Diagnosis not present

## 2021-04-23 DIAGNOSIS — Z6828 Body mass index (BMI) 28.0-28.9, adult: Secondary | ICD-10-CM | POA: Diagnosis not present

## 2021-04-23 DIAGNOSIS — E291 Testicular hypofunction: Secondary | ICD-10-CM | POA: Diagnosis not present

## 2021-04-23 DIAGNOSIS — R5383 Other fatigue: Secondary | ICD-10-CM | POA: Diagnosis not present

## 2021-05-01 ENCOUNTER — Other Ambulatory Visit (HOSPITAL_COMMUNITY): Payer: Self-pay

## 2021-05-01 MED ORDER — DOXYCYCLINE HYCLATE 100 MG PO TABS
100.0000 mg | ORAL_TABLET | Freq: Two times a day (BID) | ORAL | 0 refills | Status: AC
  Filled 2021-05-01: qty 20, 10d supply, fill #0

## 2021-05-23 DIAGNOSIS — Z79890 Hormone replacement therapy (postmenopausal): Secondary | ICD-10-CM | POA: Diagnosis not present

## 2021-05-23 DIAGNOSIS — R5383 Other fatigue: Secondary | ICD-10-CM | POA: Diagnosis not present

## 2021-05-23 DIAGNOSIS — E291 Testicular hypofunction: Secondary | ICD-10-CM | POA: Diagnosis not present

## 2021-06-07 DIAGNOSIS — R6882 Decreased libido: Secondary | ICD-10-CM | POA: Diagnosis not present

## 2021-06-07 DIAGNOSIS — E291 Testicular hypofunction: Secondary | ICD-10-CM | POA: Diagnosis not present

## 2021-06-07 DIAGNOSIS — R5383 Other fatigue: Secondary | ICD-10-CM | POA: Diagnosis not present

## 2021-06-07 DIAGNOSIS — Z6829 Body mass index (BMI) 29.0-29.9, adult: Secondary | ICD-10-CM | POA: Diagnosis not present

## 2021-06-19 DIAGNOSIS — E559 Vitamin D deficiency, unspecified: Secondary | ICD-10-CM | POA: Diagnosis not present

## 2021-06-19 DIAGNOSIS — R5383 Other fatigue: Secondary | ICD-10-CM | POA: Diagnosis not present

## 2021-06-19 DIAGNOSIS — R209 Unspecified disturbances of skin sensation: Secondary | ICD-10-CM | POA: Diagnosis not present

## 2021-07-06 ENCOUNTER — Other Ambulatory Visit (HOSPITAL_COMMUNITY): Payer: Self-pay

## 2021-07-06 MED ORDER — POLYSACCHARIDE IRON COMPLEX 150 MG PO CAPS
150.0000 mg | ORAL_CAPSULE | Freq: Every day | ORAL | 0 refills | Status: DC
  Filled 2021-07-06: qty 90, 90d supply, fill #0

## 2021-10-03 ENCOUNTER — Other Ambulatory Visit (HOSPITAL_COMMUNITY): Payer: Self-pay

## 2021-10-04 ENCOUNTER — Other Ambulatory Visit (HOSPITAL_COMMUNITY): Payer: Self-pay

## 2021-10-04 MED ORDER — POLYSACCHARIDE IRON COMPLEX 150 MG PO CAPS
150.0000 mg | ORAL_CAPSULE | Freq: Every day | ORAL | 0 refills | Status: DC
  Filled 2021-10-04: qty 90, 90d supply, fill #0

## 2021-12-31 ENCOUNTER — Other Ambulatory Visit (HOSPITAL_COMMUNITY): Payer: Self-pay

## 2021-12-31 MED ORDER — POLYSACCHARIDE IRON COMPLEX 150 MG PO CAPS
150.0000 mg | ORAL_CAPSULE | Freq: Every day | ORAL | 0 refills | Status: AC
  Filled 2021-12-31: qty 90, 90d supply, fill #0

## 2022-01-03 ENCOUNTER — Other Ambulatory Visit (HOSPITAL_COMMUNITY): Payer: Self-pay

## 2022-02-06 DIAGNOSIS — G4701 Insomnia due to medical condition: Secondary | ICD-10-CM | POA: Diagnosis not present

## 2022-02-06 DIAGNOSIS — R891 Abnormal level of hormones in specimens from other organs, systems and tissues: Secondary | ICD-10-CM | POA: Diagnosis not present

## 2022-02-06 DIAGNOSIS — R635 Abnormal weight gain: Secondary | ICD-10-CM | POA: Diagnosis not present

## 2022-02-06 DIAGNOSIS — Z131 Encounter for screening for diabetes mellitus: Secondary | ICD-10-CM | POA: Diagnosis not present

## 2022-02-06 DIAGNOSIS — E559 Vitamin D deficiency, unspecified: Secondary | ICD-10-CM | POA: Diagnosis not present

## 2022-02-06 DIAGNOSIS — R208 Other disturbances of skin sensation: Secondary | ICD-10-CM | POA: Diagnosis not present

## 2022-02-06 DIAGNOSIS — R5383 Other fatigue: Secondary | ICD-10-CM | POA: Diagnosis not present

## 2022-02-06 DIAGNOSIS — G609 Hereditary and idiopathic neuropathy, unspecified: Secondary | ICD-10-CM | POA: Diagnosis not present

## 2022-02-06 DIAGNOSIS — J309 Allergic rhinitis, unspecified: Secondary | ICD-10-CM | POA: Diagnosis not present

## 2022-02-08 DIAGNOSIS — R5383 Other fatigue: Secondary | ICD-10-CM | POA: Diagnosis not present

## 2022-02-08 DIAGNOSIS — R891 Abnormal level of hormones in specimens from other organs, systems and tissues: Secondary | ICD-10-CM | POA: Diagnosis not present

## 2022-02-08 DIAGNOSIS — D649 Anemia, unspecified: Secondary | ICD-10-CM | POA: Diagnosis not present

## 2022-02-08 DIAGNOSIS — E559 Vitamin D deficiency, unspecified: Secondary | ICD-10-CM | POA: Diagnosis not present

## 2022-02-08 DIAGNOSIS — R635 Abnormal weight gain: Secondary | ICD-10-CM | POA: Diagnosis not present

## 2022-02-08 DIAGNOSIS — Z131 Encounter for screening for diabetes mellitus: Secondary | ICD-10-CM | POA: Diagnosis not present

## 2022-02-08 DIAGNOSIS — E785 Hyperlipidemia, unspecified: Secondary | ICD-10-CM | POA: Diagnosis not present

## 2022-02-15 ENCOUNTER — Other Ambulatory Visit: Payer: Self-pay | Admitting: Internal Medicine

## 2022-02-15 DIAGNOSIS — R945 Abnormal results of liver function studies: Secondary | ICD-10-CM

## 2022-02-21 DIAGNOSIS — R748 Abnormal levels of other serum enzymes: Secondary | ICD-10-CM | POA: Diagnosis not present

## 2022-02-21 DIAGNOSIS — Z125 Encounter for screening for malignant neoplasm of prostate: Secondary | ICD-10-CM | POA: Diagnosis not present

## 2022-02-26 ENCOUNTER — Ambulatory Visit
Admission: RE | Admit: 2022-02-26 | Discharge: 2022-02-26 | Disposition: A | Discharge status: Home / Self Care | Payer: 59 | Source: Ambulatory Visit | Attending: Internal Medicine | Admitting: Internal Medicine

## 2022-02-26 DIAGNOSIS — R945 Abnormal results of liver function studies: Secondary | ICD-10-CM

## 2022-02-26 DIAGNOSIS — R7989 Other specified abnormal findings of blood chemistry: Secondary | ICD-10-CM | POA: Diagnosis not present

## 2022-03-05 DIAGNOSIS — E291 Testicular hypofunction: Secondary | ICD-10-CM | POA: Diagnosis not present

## 2022-03-05 DIAGNOSIS — R891 Abnormal level of hormones in specimens from other organs, systems and tissues: Secondary | ICD-10-CM | POA: Diagnosis not present

## 2022-04-19 ENCOUNTER — Other Ambulatory Visit (HOSPITAL_COMMUNITY): Payer: Self-pay

## 2022-04-19 DIAGNOSIS — R7989 Other specified abnormal findings of blood chemistry: Secondary | ICD-10-CM | POA: Diagnosis not present

## 2022-05-22 DIAGNOSIS — R5383 Other fatigue: Secondary | ICD-10-CM | POA: Diagnosis not present

## 2022-05-22 DIAGNOSIS — E559 Vitamin D deficiency, unspecified: Secondary | ICD-10-CM | POA: Diagnosis not present

## 2022-05-22 DIAGNOSIS — R238 Other skin changes: Secondary | ICD-10-CM | POA: Diagnosis not present

## 2022-05-22 DIAGNOSIS — L309 Dermatitis, unspecified: Secondary | ICD-10-CM | POA: Diagnosis not present

## 2022-05-22 DIAGNOSIS — R891 Abnormal level of hormones in specimens from other organs, systems and tissues: Secondary | ICD-10-CM | POA: Diagnosis not present

## 2022-05-22 DIAGNOSIS — K760 Fatty (change of) liver, not elsewhere classified: Secondary | ICD-10-CM | POA: Diagnosis not present

## 2022-05-22 DIAGNOSIS — R748 Abnormal levels of other serum enzymes: Secondary | ICD-10-CM | POA: Diagnosis not present

## 2022-05-22 DIAGNOSIS — F4381 Prolonged grief disorder: Secondary | ICD-10-CM | POA: Diagnosis not present

## 2022-05-22 DIAGNOSIS — E230 Hypopituitarism: Secondary | ICD-10-CM | POA: Diagnosis not present

## 2022-06-04 DIAGNOSIS — R748 Abnormal levels of other serum enzymes: Secondary | ICD-10-CM | POA: Diagnosis not present

## 2022-06-24 DIAGNOSIS — E291 Testicular hypofunction: Secondary | ICD-10-CM | POA: Diagnosis not present

## 2022-07-01 DIAGNOSIS — L821 Other seborrheic keratosis: Secondary | ICD-10-CM | POA: Diagnosis not present

## 2022-11-10 IMAGING — US US ABDOMEN LIMITED
1 series · 14 of 25 positions shown · non-contrast
Comparison: None Available.

CLINICAL DATA: Elevated LFTs

EXAM:
ULTRASOUND ABDOMEN LIMITED RIGHT UPPER QUADRANT

[Series 1: us abdomen limited · 0.19mm/px · 14 of 41 slices shown]
[im 1/41]
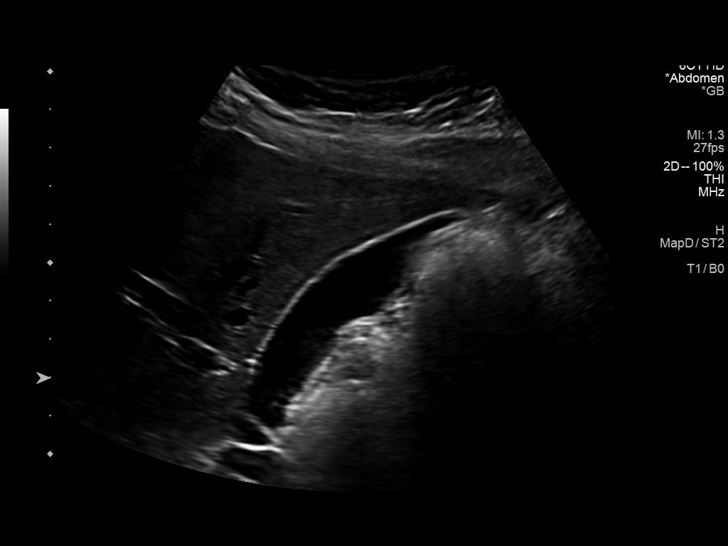
[im 4/41]
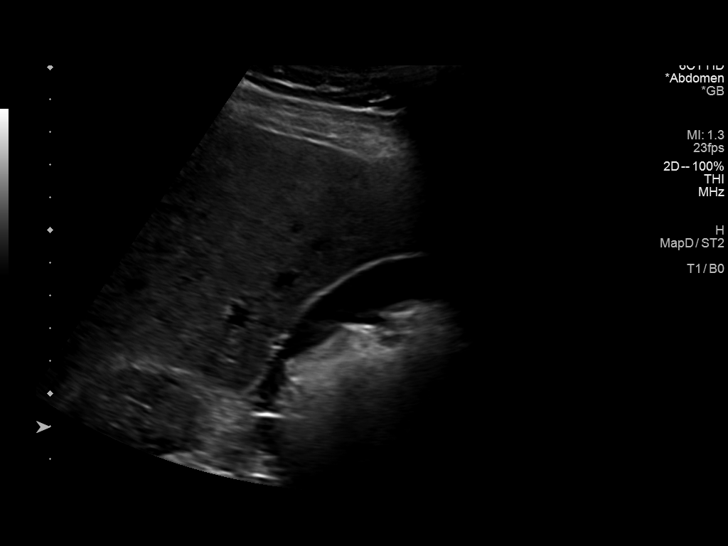
[im 7/41]
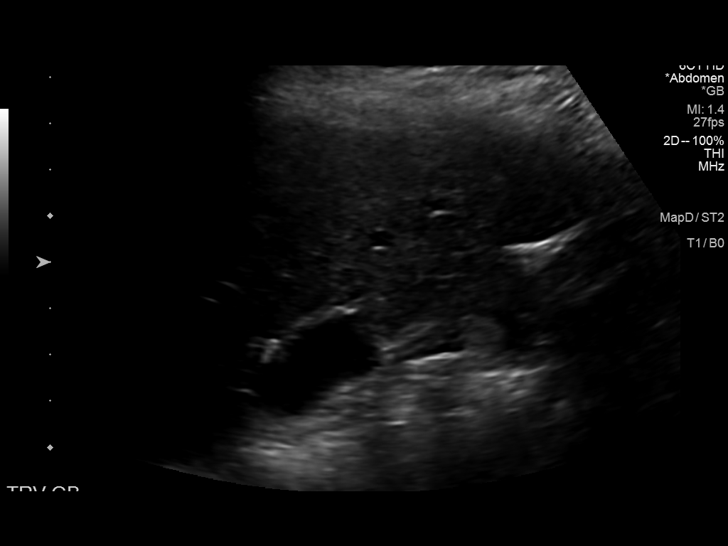
[im 11/41]
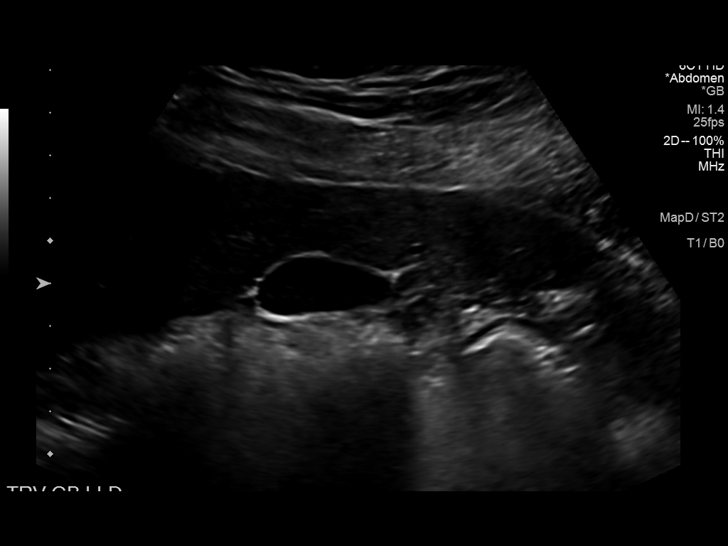
[im 14/41]
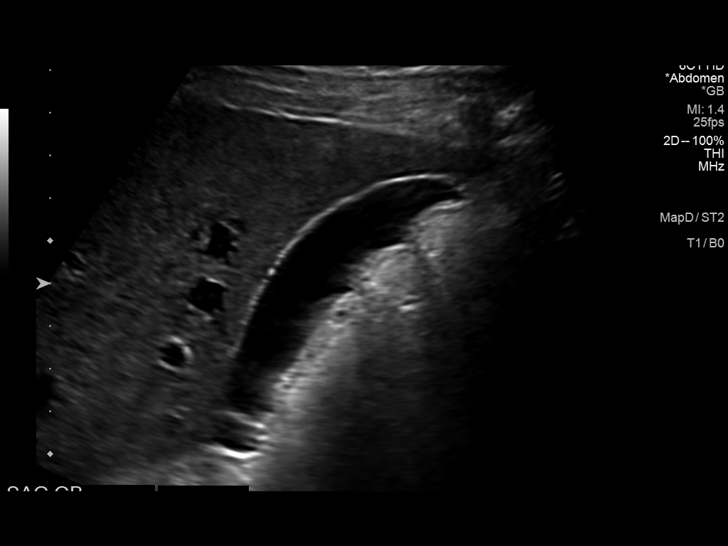
[im 16/41]
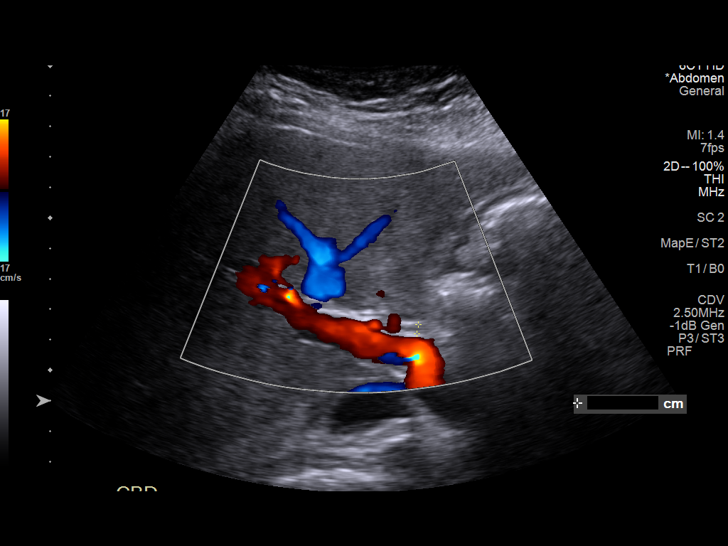
[im 19/41]
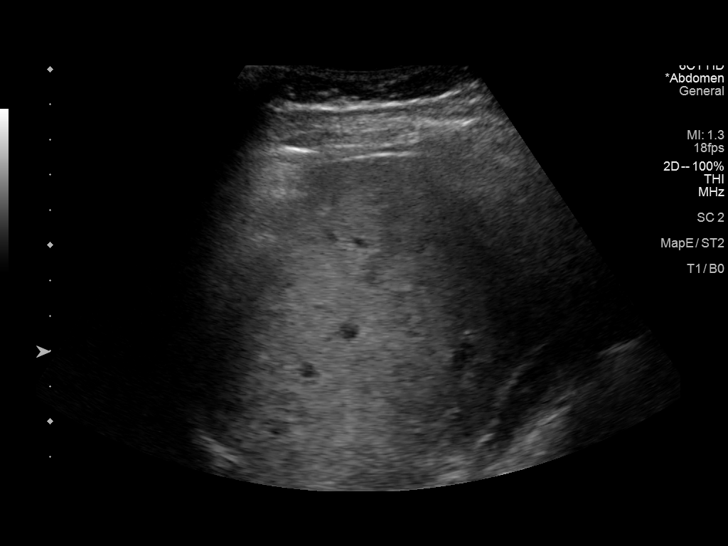
[im 22/41]
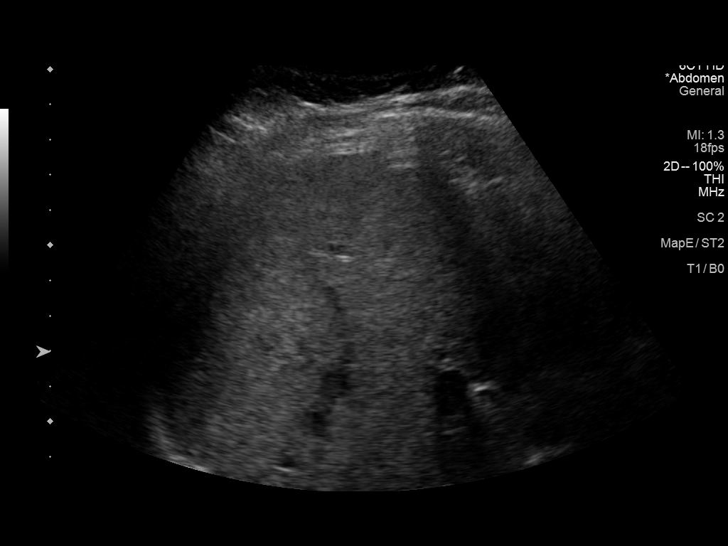
[im 26/41]
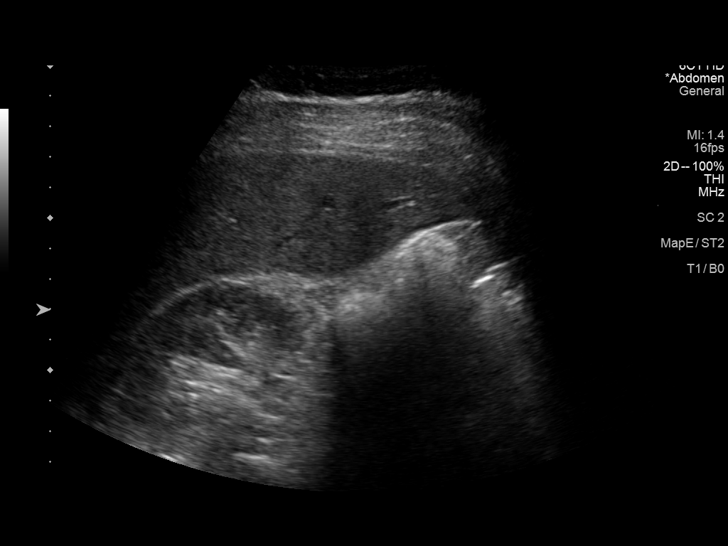
[im 27/41]
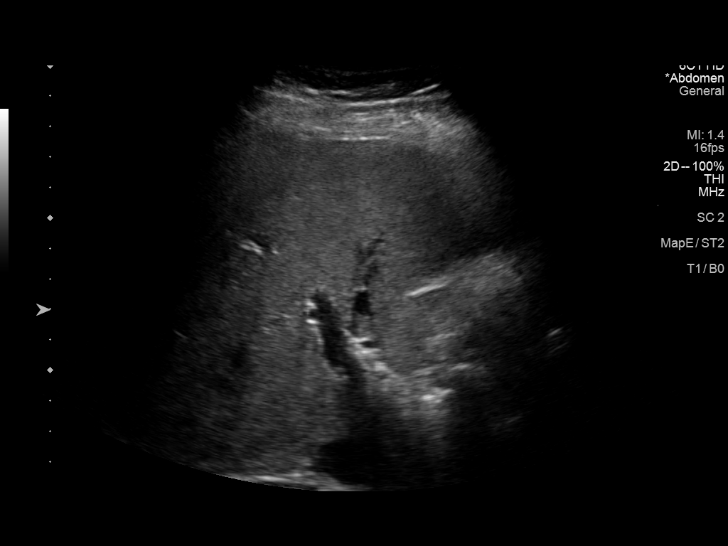
[im 31/41]
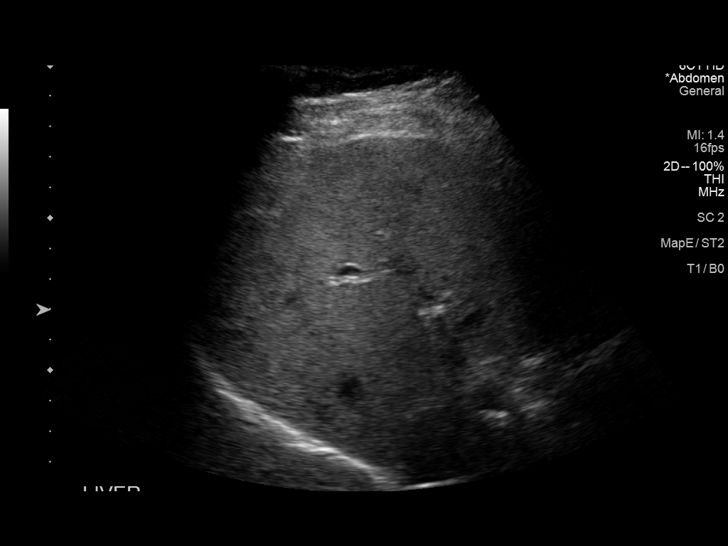
[im 34/41]
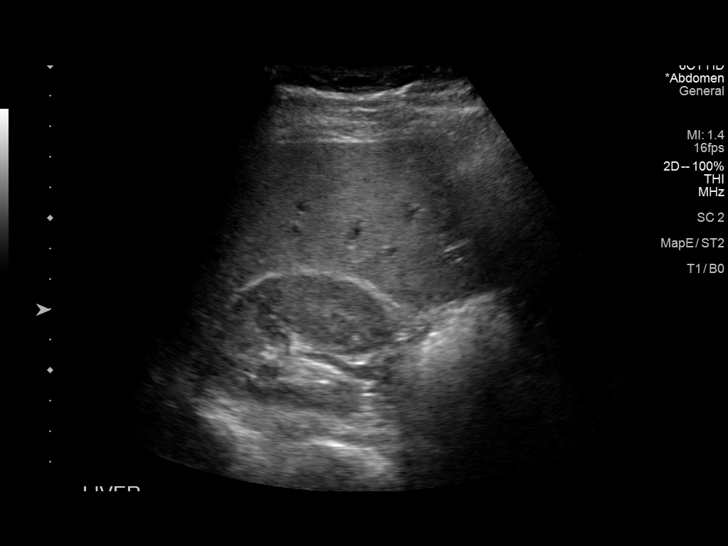
[im 37/41]
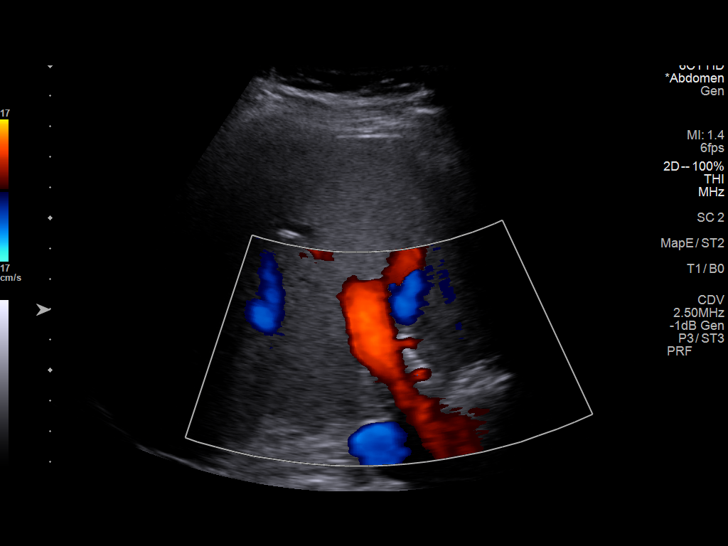
[im 41/41]
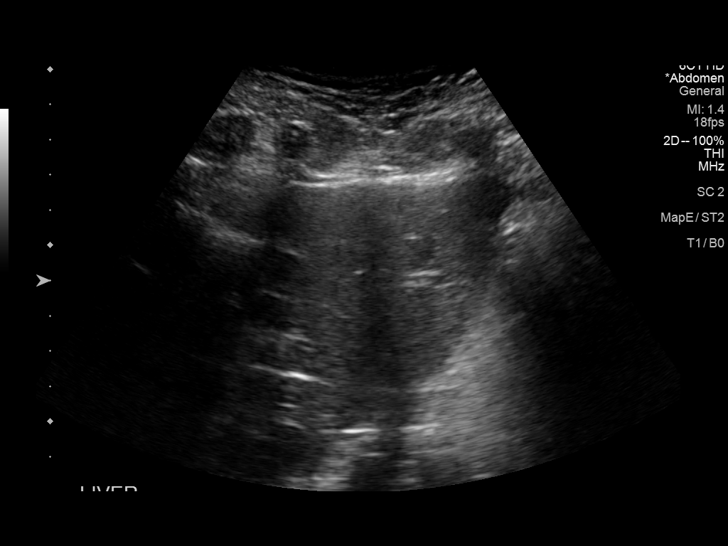

[14 of 25 positions shown; findings below may reference images not displayed]

FINDINGS: Gallbladder:

No gallstones or wall thickening visualized. No sonographic Murphy
sign noted by sonographer.

Common bile duct:

Diameter: 3 mm

Liver:

Inhomogeneous, increased echogenicity of the parenchyma with no
focal mass identified. Portal vein is patent on color Doppler
imaging with normal direction of blood flow towards the liver.

Other: None.
IMPRESSION: Abnormal appearance of the liver parenchyma suggesting hepatic
steatosis and/or other hepatocellular disease.

## 2022-12-30 DIAGNOSIS — E291 Testicular hypofunction: Secondary | ICD-10-CM | POA: Diagnosis not present

## 2023-06-30 DIAGNOSIS — E291 Testicular hypofunction: Secondary | ICD-10-CM | POA: Diagnosis not present

## 2023-08-26 DIAGNOSIS — R5383 Other fatigue: Secondary | ICD-10-CM | POA: Diagnosis not present

## 2023-08-26 DIAGNOSIS — R891 Abnormal level of hormones in specimens from other organs, systems and tissues: Secondary | ICD-10-CM | POA: Diagnosis not present

## 2023-08-26 DIAGNOSIS — D1722 Benign lipomatous neoplasm of skin and subcutaneous tissue of left arm: Secondary | ICD-10-CM | POA: Diagnosis not present

## 2023-08-26 DIAGNOSIS — K760 Fatty (change of) liver, not elsewhere classified: Secondary | ICD-10-CM | POA: Diagnosis not present

## 2023-08-26 DIAGNOSIS — E663 Overweight: Secondary | ICD-10-CM | POA: Diagnosis not present

## 2023-08-26 DIAGNOSIS — E230 Hypopituitarism: Secondary | ICD-10-CM | POA: Diagnosis not present

## 2023-08-26 DIAGNOSIS — E559 Vitamin D deficiency, unspecified: Secondary | ICD-10-CM | POA: Diagnosis not present

## 2023-08-26 DIAGNOSIS — R238 Other skin changes: Secondary | ICD-10-CM | POA: Diagnosis not present

## 2023-08-26 DIAGNOSIS — R748 Abnormal levels of other serum enzymes: Secondary | ICD-10-CM | POA: Diagnosis not present

## 2023-08-26 DIAGNOSIS — E785 Hyperlipidemia, unspecified: Secondary | ICD-10-CM | POA: Diagnosis not present

## 2023-08-29 DIAGNOSIS — Z131 Encounter for screening for diabetes mellitus: Secondary | ICD-10-CM | POA: Diagnosis not present

## 2023-08-29 DIAGNOSIS — Z136 Encounter for screening for cardiovascular disorders: Secondary | ICD-10-CM | POA: Diagnosis not present

## 2023-08-29 DIAGNOSIS — E559 Vitamin D deficiency, unspecified: Secondary | ICD-10-CM | POA: Diagnosis not present

## 2023-08-29 DIAGNOSIS — R238 Other skin changes: Secondary | ICD-10-CM | POA: Diagnosis not present

## 2023-08-29 DIAGNOSIS — G609 Hereditary and idiopathic neuropathy, unspecified: Secondary | ICD-10-CM | POA: Diagnosis not present

## 2023-08-29 DIAGNOSIS — E785 Hyperlipidemia, unspecified: Secondary | ICD-10-CM | POA: Diagnosis not present

## 2023-09-08 ENCOUNTER — Other Ambulatory Visit (HOSPITAL_COMMUNITY): Payer: Self-pay

## 2023-09-08 MED ORDER — VITAMIN D (ERGOCALCIFEROL) 1.25 MG (50000 UNIT) PO CAPS
ORAL_CAPSULE | ORAL | 1 refills | Status: AC
  Filled 2023-09-08: qty 12, 84d supply, fill #0

## 2023-09-26 ENCOUNTER — Other Ambulatory Visit (HOSPITAL_COMMUNITY): Payer: Self-pay

## 2023-10-30 DIAGNOSIS — R8290 Unspecified abnormal findings in urine: Secondary | ICD-10-CM | POA: Diagnosis not present

## 2023-12-22 DIAGNOSIS — E291 Testicular hypofunction: Secondary | ICD-10-CM | POA: Diagnosis not present

## 2024-02-12 ENCOUNTER — Other Ambulatory Visit (HOSPITAL_COMMUNITY): Payer: Self-pay

## 2024-02-12 MED ORDER — CHLORHEXIDINE GLUCONATE 0.12 % MT SOLN
15.0000 mL | Freq: Two times a day (BID) | OROMUCOSAL | 0 refills | Status: AC
  Filled 2024-02-12: qty 473, 16d supply, fill #0

## 2024-02-17 ENCOUNTER — Other Ambulatory Visit (HOSPITAL_COMMUNITY): Payer: Self-pay

## 2024-02-18 ENCOUNTER — Other Ambulatory Visit (HOSPITAL_COMMUNITY): Payer: Self-pay

## 2024-02-18 MED ORDER — AZITHROMYCIN 500 MG PO TABS
ORAL_TABLET | ORAL | 0 refills | Status: AC
  Filled 2024-02-18: qty 3, 3d supply, fill #0

## 2024-02-19 ENCOUNTER — Other Ambulatory Visit (HOSPITAL_COMMUNITY): Payer: Self-pay

## 2024-02-25 ENCOUNTER — Other Ambulatory Visit (HOSPITAL_COMMUNITY): Payer: Self-pay

## 2024-02-26 ENCOUNTER — Other Ambulatory Visit (HOSPITAL_COMMUNITY): Payer: Self-pay

## 2024-02-26 MED ORDER — AZITHROMYCIN 500 MG PO TABS
500.0000 mg | ORAL_TABLET | Freq: Every day | ORAL | 0 refills | Status: AC
  Filled 2024-02-26: qty 3, 3d supply, fill #0

## 2024-03-03 DIAGNOSIS — E785 Hyperlipidemia, unspecified: Secondary | ICD-10-CM | POA: Diagnosis not present

## 2024-03-03 DIAGNOSIS — R748 Abnormal levels of other serum enzymes: Secondary | ICD-10-CM | POA: Diagnosis not present

## 2024-03-03 DIAGNOSIS — Z136 Encounter for screening for cardiovascular disorders: Secondary | ICD-10-CM | POA: Diagnosis not present

## 2024-03-03 DIAGNOSIS — E663 Overweight: Secondary | ICD-10-CM | POA: Diagnosis not present

## 2024-03-03 DIAGNOSIS — Z125 Encounter for screening for malignant neoplasm of prostate: Secondary | ICD-10-CM | POA: Diagnosis not present

## 2024-03-03 DIAGNOSIS — G609 Hereditary and idiopathic neuropathy, unspecified: Secondary | ICD-10-CM | POA: Diagnosis not present

## 2024-03-03 DIAGNOSIS — Z1211 Encounter for screening for malignant neoplasm of colon: Secondary | ICD-10-CM | POA: Diagnosis not present

## 2024-03-03 DIAGNOSIS — Z23 Encounter for immunization: Secondary | ICD-10-CM | POA: Diagnosis not present

## 2024-03-03 DIAGNOSIS — Z0001 Encounter for general adult medical examination with abnormal findings: Secondary | ICD-10-CM | POA: Diagnosis not present

## 2024-03-03 DIAGNOSIS — R5383 Other fatigue: Secondary | ICD-10-CM | POA: Diagnosis not present

## 2024-03-11 ENCOUNTER — Other Ambulatory Visit (HOSPITAL_COMMUNITY): Payer: Self-pay

## 2024-03-12 DIAGNOSIS — Z0001 Encounter for general adult medical examination with abnormal findings: Secondary | ICD-10-CM | POA: Diagnosis not present

## 2024-03-12 DIAGNOSIS — Z125 Encounter for screening for malignant neoplasm of prostate: Secondary | ICD-10-CM | POA: Diagnosis not present

## 2024-03-12 DIAGNOSIS — E785 Hyperlipidemia, unspecified: Secondary | ICD-10-CM | POA: Diagnosis not present

## 2024-03-12 DIAGNOSIS — E559 Vitamin D deficiency, unspecified: Secondary | ICD-10-CM | POA: Diagnosis not present

## 2024-03-12 DIAGNOSIS — Z136 Encounter for screening for cardiovascular disorders: Secondary | ICD-10-CM | POA: Diagnosis not present

## 2024-03-12 DIAGNOSIS — R7303 Prediabetes: Secondary | ICD-10-CM | POA: Diagnosis not present

## 2024-03-22 ENCOUNTER — Other Ambulatory Visit: Payer: Self-pay | Admitting: Internal Medicine

## 2024-03-22 DIAGNOSIS — R972 Elevated prostate specific antigen [PSA]: Secondary | ICD-10-CM | POA: Diagnosis not present

## 2024-03-22 DIAGNOSIS — E7849 Other hyperlipidemia: Secondary | ICD-10-CM

## 2024-03-22 DIAGNOSIS — R7303 Prediabetes: Secondary | ICD-10-CM | POA: Diagnosis not present

## 2024-03-22 DIAGNOSIS — R748 Abnormal levels of other serum enzymes: Secondary | ICD-10-CM | POA: Diagnosis not present

## 2024-03-26 ENCOUNTER — Other Ambulatory Visit: Payer: Self-pay

## 2024-05-18 DIAGNOSIS — R748 Abnormal levels of other serum enzymes: Secondary | ICD-10-CM | POA: Diagnosis not present

## 2024-05-18 DIAGNOSIS — R972 Elevated prostate specific antigen [PSA]: Secondary | ICD-10-CM | POA: Diagnosis not present

## 2024-05-24 DIAGNOSIS — E291 Testicular hypofunction: Secondary | ICD-10-CM | POA: Diagnosis not present

## 2024-06-01 ENCOUNTER — Other Ambulatory Visit: Payer: Self-pay | Admitting: Internal Medicine

## 2024-06-01 DIAGNOSIS — R748 Abnormal levels of other serum enzymes: Secondary | ICD-10-CM

## 2024-06-03 ENCOUNTER — Ambulatory Visit
Admission: RE | Admit: 2024-06-03 | Discharge: 2024-06-03 | Disposition: A | Discharge status: Home / Self Care | Payer: Self-pay | Source: Ambulatory Visit | Attending: Internal Medicine | Admitting: Internal Medicine

## 2024-06-03 DIAGNOSIS — R972 Elevated prostate specific antigen [PSA]: Secondary | ICD-10-CM | POA: Diagnosis not present

## 2024-06-03 DIAGNOSIS — E538 Deficiency of other specified B group vitamins: Secondary | ICD-10-CM | POA: Diagnosis not present

## 2024-06-03 DIAGNOSIS — R7303 Prediabetes: Secondary | ICD-10-CM | POA: Diagnosis not present

## 2024-06-03 DIAGNOSIS — E559 Vitamin D deficiency, unspecified: Secondary | ICD-10-CM | POA: Diagnosis not present

## 2024-06-03 DIAGNOSIS — E7849 Other hyperlipidemia: Secondary | ICD-10-CM | POA: Diagnosis not present

## 2024-06-03 DIAGNOSIS — R748 Abnormal levels of other serum enzymes: Secondary | ICD-10-CM

## 2024-06-03 DIAGNOSIS — R7401 Elevation of levels of liver transaminase levels: Secondary | ICD-10-CM | POA: Diagnosis not present
